# Patient Record
Sex: Male | Born: 1960
Health system: Southern US, Community
[De-identification: ages and names within clinical notes are randomized; demographics above are authoritative.]

## PROBLEM LIST (undated history)

## (undated) DIAGNOSIS — T7840XA Allergy, unspecified, initial encounter: Secondary | ICD-10-CM

## (undated) DIAGNOSIS — E7800 Pure hypercholesterolemia, unspecified: Secondary | ICD-10-CM

## (undated) DIAGNOSIS — E039 Hypothyroidism, unspecified: Secondary | ICD-10-CM

## (undated) DIAGNOSIS — D333 Benign neoplasm of cranial nerves: Secondary | ICD-10-CM

## (undated) DIAGNOSIS — H9311 Tinnitus, right ear: Secondary | ICD-10-CM

## (undated) DIAGNOSIS — J310 Chronic rhinitis: Secondary | ICD-10-CM

## (undated) HISTORY — DX: Hypothyroidism, unspecified: E03.9

## (undated) HISTORY — PX: NO PAST SURGERIES: SHX2092

## (undated) HISTORY — PX: POLYPECTOMY: SHX149

## (undated) HISTORY — DX: Benign neoplasm of cranial nerves: D33.3

## (undated) HISTORY — DX: Chronic rhinitis: J31.0

## (undated) HISTORY — DX: Allergy, unspecified, initial encounter: T78.40XA

## (undated) HISTORY — PX: WISDOM TOOTH EXTRACTION: SHX21

## (undated) HISTORY — DX: Pure hypercholesterolemia, unspecified: E78.00

## (undated) HISTORY — DX: Tinnitus, right ear: H93.11

---

## 2006-11-11 ENCOUNTER — Ambulatory Visit: Payer: Self-pay | Admitting: Family Medicine

## 2006-11-11 LAB — CONVERTED CEMR LAB
ALT: 17 units/L (ref 0–40)
AST: 22 units/L (ref 0–37)
LDL DIRECT: 171.1 mg/dL
Triglyceride fasting, serum: 68 mg/dL (ref 0–149)

## 2007-02-17 ENCOUNTER — Ambulatory Visit: Payer: Self-pay | Admitting: Family Medicine

## 2007-02-17 LAB — CONVERTED CEMR LAB
ALT: 19 units/L (ref 0–40)
AST: 21 units/L (ref 0–37)
Cholesterol: 182 mg/dL (ref 0–200)
LDL Cholesterol: 115 mg/dL — ABNORMAL HIGH (ref 0–99)

## 2007-07-07 ENCOUNTER — Ambulatory Visit: Payer: Self-pay | Admitting: Family Medicine

## 2007-07-08 LAB — CONVERTED CEMR LAB
Cholesterol: 201 mg/dL (ref 0–200)
Total CHOL/HDL Ratio: 4.1

## 2007-07-09 ENCOUNTER — Telehealth (INDEPENDENT_AMBULATORY_CARE_PROVIDER_SITE_OTHER): Payer: Self-pay | Admitting: *Deleted

## 2007-08-13 ENCOUNTER — Ambulatory Visit: Payer: Self-pay | Admitting: Family Medicine

## 2007-12-22 ENCOUNTER — Telehealth (INDEPENDENT_AMBULATORY_CARE_PROVIDER_SITE_OTHER): Payer: Self-pay | Admitting: *Deleted

## 2007-12-31 ENCOUNTER — Ambulatory Visit: Payer: Self-pay | Admitting: Family Medicine

## 2007-12-31 DIAGNOSIS — H9319 Tinnitus, unspecified ear: Secondary | ICD-10-CM | POA: Insufficient documentation

## 2007-12-31 DIAGNOSIS — J309 Allergic rhinitis, unspecified: Secondary | ICD-10-CM | POA: Insufficient documentation

## 2008-02-09 ENCOUNTER — Encounter: Payer: Self-pay | Admitting: Family Medicine

## 2008-02-19 ENCOUNTER — Telehealth (INDEPENDENT_AMBULATORY_CARE_PROVIDER_SITE_OTHER): Payer: Self-pay | Admitting: *Deleted

## 2008-02-27 ENCOUNTER — Encounter (INDEPENDENT_AMBULATORY_CARE_PROVIDER_SITE_OTHER): Payer: Self-pay | Admitting: Family Medicine

## 2008-03-03 ENCOUNTER — Ambulatory Visit: Payer: Self-pay | Admitting: Family Medicine

## 2008-07-05 ENCOUNTER — Ambulatory Visit: Payer: Self-pay | Admitting: *Deleted

## 2008-07-05 DIAGNOSIS — E7800 Pure hypercholesterolemia, unspecified: Secondary | ICD-10-CM | POA: Insufficient documentation

## 2008-07-05 DIAGNOSIS — M25519 Pain in unspecified shoulder: Secondary | ICD-10-CM | POA: Insufficient documentation

## 2008-07-05 LAB — CONVERTED CEMR LAB
ALT: 22 units/L (ref 0–53)
AST: 24 units/L (ref 0–37)
Alkaline Phosphatase: 54 units/L (ref 39–117)
BUN: 16 mg/dL (ref 6–23)
Bilirubin, Direct: 0.1 mg/dL (ref 0.0–0.3)
Creatinine, Ser: 0.9 mg/dL (ref 0.4–1.5)
Eosinophils Relative: 2.9 % (ref 0.0–5.0)
GFR calc Af Amer: 117 mL/min
Glucose, Bld: 93 mg/dL (ref 70–99)
HCT: 44 % (ref 39.0–52.0)
Lymphocytes Relative: 17.1 % (ref 12.0–46.0)
Monocytes Relative: 8 % (ref 3.0–12.0)
Neutrophils Relative %: 72 % (ref 43.0–77.0)
Phosphorus: 3.2 mg/dL (ref 2.3–4.6)
Platelets: 221 10*3/uL (ref 150–400)
Total Bilirubin: 1.4 mg/dL — ABNORMAL HIGH (ref 0.3–1.2)
Total CHOL/HDL Ratio: 4.4
Total Protein: 7.6 g/dL (ref 6.0–8.3)
Triglycerides: 126 mg/dL (ref 0–149)
VLDL: 25 mg/dL (ref 0–40)
WBC: 7.1 10*3/uL (ref 4.5–10.5)

## 2008-07-06 ENCOUNTER — Encounter (INDEPENDENT_AMBULATORY_CARE_PROVIDER_SITE_OTHER): Payer: Self-pay | Admitting: *Deleted

## 2008-07-06 DIAGNOSIS — R17 Unspecified jaundice: Secondary | ICD-10-CM | POA: Insufficient documentation

## 2008-07-21 ENCOUNTER — Telehealth (INDEPENDENT_AMBULATORY_CARE_PROVIDER_SITE_OTHER): Payer: Self-pay | Admitting: *Deleted

## 2008-07-26 ENCOUNTER — Ambulatory Visit (HOSPITAL_BASED_OUTPATIENT_CLINIC_OR_DEPARTMENT_OTHER): Admission: RE | Admit: 2008-07-26 | Discharge: 2008-07-26 | Payer: Self-pay | Admitting: *Deleted

## 2008-07-29 ENCOUNTER — Encounter: Admission: RE | Admit: 2008-07-29 | Discharge: 2008-08-19 | Payer: Self-pay | Admitting: *Deleted

## 2008-07-30 ENCOUNTER — Ambulatory Visit: Payer: Self-pay | Admitting: *Deleted

## 2008-07-30 LAB — CONVERTED CEMR LAB
ALT: 23 units/L (ref 0–53)
AST: 20 units/L (ref 0–37)
Albumin: 4.4 g/dL (ref 3.5–5.2)
Alkaline Phosphatase: 53 units/L (ref 39–117)

## 2008-08-03 ENCOUNTER — Telehealth (INDEPENDENT_AMBULATORY_CARE_PROVIDER_SITE_OTHER): Payer: Self-pay | Admitting: *Deleted

## 2008-08-11 ENCOUNTER — Encounter (INDEPENDENT_AMBULATORY_CARE_PROVIDER_SITE_OTHER): Payer: Self-pay | Admitting: *Deleted

## 2008-08-13 ENCOUNTER — Ambulatory Visit: Payer: Self-pay | Admitting: *Deleted

## 2008-08-16 LAB — CONVERTED CEMR LAB
Cholesterol: 239 mg/dL (ref 0–200)
Total CHOL/HDL Ratio: 5.8

## 2008-08-19 ENCOUNTER — Telehealth (INDEPENDENT_AMBULATORY_CARE_PROVIDER_SITE_OTHER): Payer: Self-pay | Admitting: *Deleted

## 2008-08-19 ENCOUNTER — Encounter (INDEPENDENT_AMBULATORY_CARE_PROVIDER_SITE_OTHER): Payer: Self-pay | Admitting: *Deleted

## 2008-08-20 ENCOUNTER — Telehealth (INDEPENDENT_AMBULATORY_CARE_PROVIDER_SITE_OTHER): Payer: Self-pay | Admitting: *Deleted

## 2008-09-01 ENCOUNTER — Encounter (INDEPENDENT_AMBULATORY_CARE_PROVIDER_SITE_OTHER): Payer: Self-pay | Admitting: *Deleted

## 2008-09-03 ENCOUNTER — Telehealth (INDEPENDENT_AMBULATORY_CARE_PROVIDER_SITE_OTHER): Payer: Self-pay | Admitting: *Deleted

## 2008-09-08 ENCOUNTER — Ambulatory Visit (HOSPITAL_BASED_OUTPATIENT_CLINIC_OR_DEPARTMENT_OTHER): Admission: RE | Admit: 2008-09-08 | Discharge: 2008-09-08 | Payer: Self-pay | Admitting: *Deleted

## 2008-10-08 ENCOUNTER — Encounter (INDEPENDENT_AMBULATORY_CARE_PROVIDER_SITE_OTHER): Payer: Self-pay | Admitting: *Deleted

## 2009-04-26 ENCOUNTER — Ambulatory Visit: Payer: Self-pay | Admitting: Internal Medicine

## 2009-04-26 LAB — CONVERTED CEMR LAB
AST: 28 units/L (ref 0–37)
Chloride: 105 meq/L (ref 96–112)
Cholesterol, target level: 200 mg/dL
Cholesterol: 193 mg/dL (ref 0–200)
Creatinine, Ser: 0.9 mg/dL (ref 0.4–1.5)
GFR calc non Af Amer: 96.2 mL/min (ref >60)
HDL goal, serum: 40 mg/dL
HDL: 41.4 mg/dL (ref >39.00)
LDL Cholesterol: 133 mg/dL — ABNORMAL HIGH (ref 0–99)
LDL Goal: 160 mg/dL
TSH: 2.94 microintl units/mL (ref 0.35–5.50)
Total Bilirubin: 0.8 mg/dL (ref 0.3–1.2)
Triglycerides: 91 mg/dL (ref 0.0–149.0)
VLDL: 18.2 mg/dL (ref 0.0–40.0)

## 2009-10-17 ENCOUNTER — Ambulatory Visit: Payer: Self-pay | Admitting: Internal Medicine

## 2009-10-17 LAB — CONVERTED CEMR LAB
ALT: 18 units/L (ref 0–53)
AST: 19 units/L (ref 0–37)
Albumin: 4.7 g/dL (ref 3.5–5.2)
Alkaline Phosphatase: 53 units/L (ref 39–117)
Total Bilirubin: 0.6 mg/dL (ref 0.3–1.2)
Total Protein: 6.7 g/dL (ref 6.0–8.3)

## 2009-10-24 ENCOUNTER — Ambulatory Visit: Payer: Self-pay | Admitting: Internal Medicine

## 2010-07-25 IMAGING — CR DG SHOULDER 2+V*R*
3 series · 3 of 3 positions shown · non-contrast
Comparison: 

CLINICAL DATA: Pain, no injury

RIGHT SHOULDER - 2+ VIEW

[w shoulder ap internal righ]
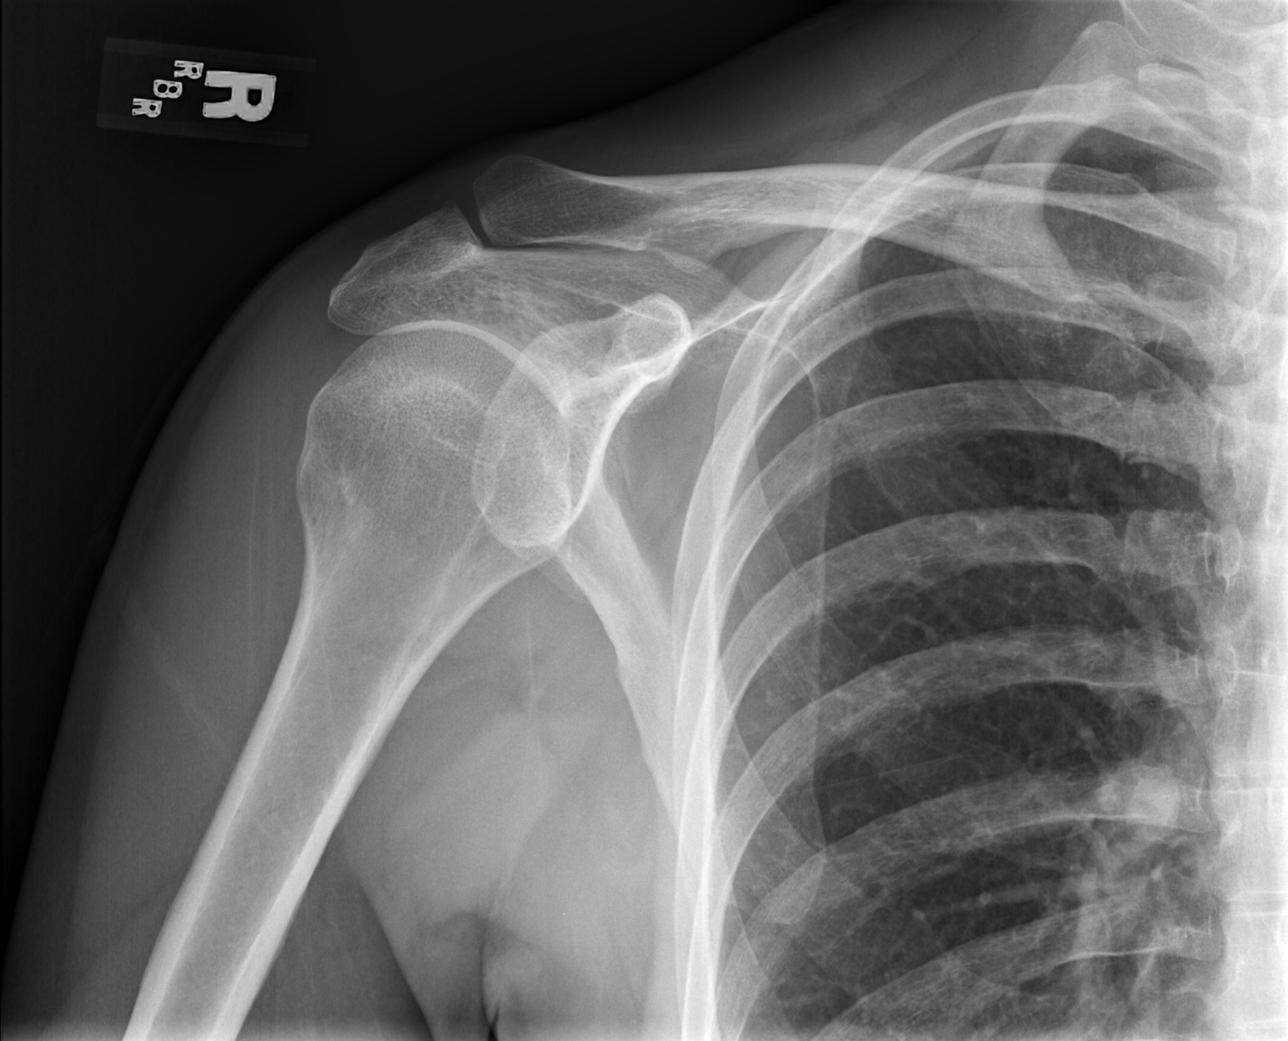

[w shoulder ap external righ]
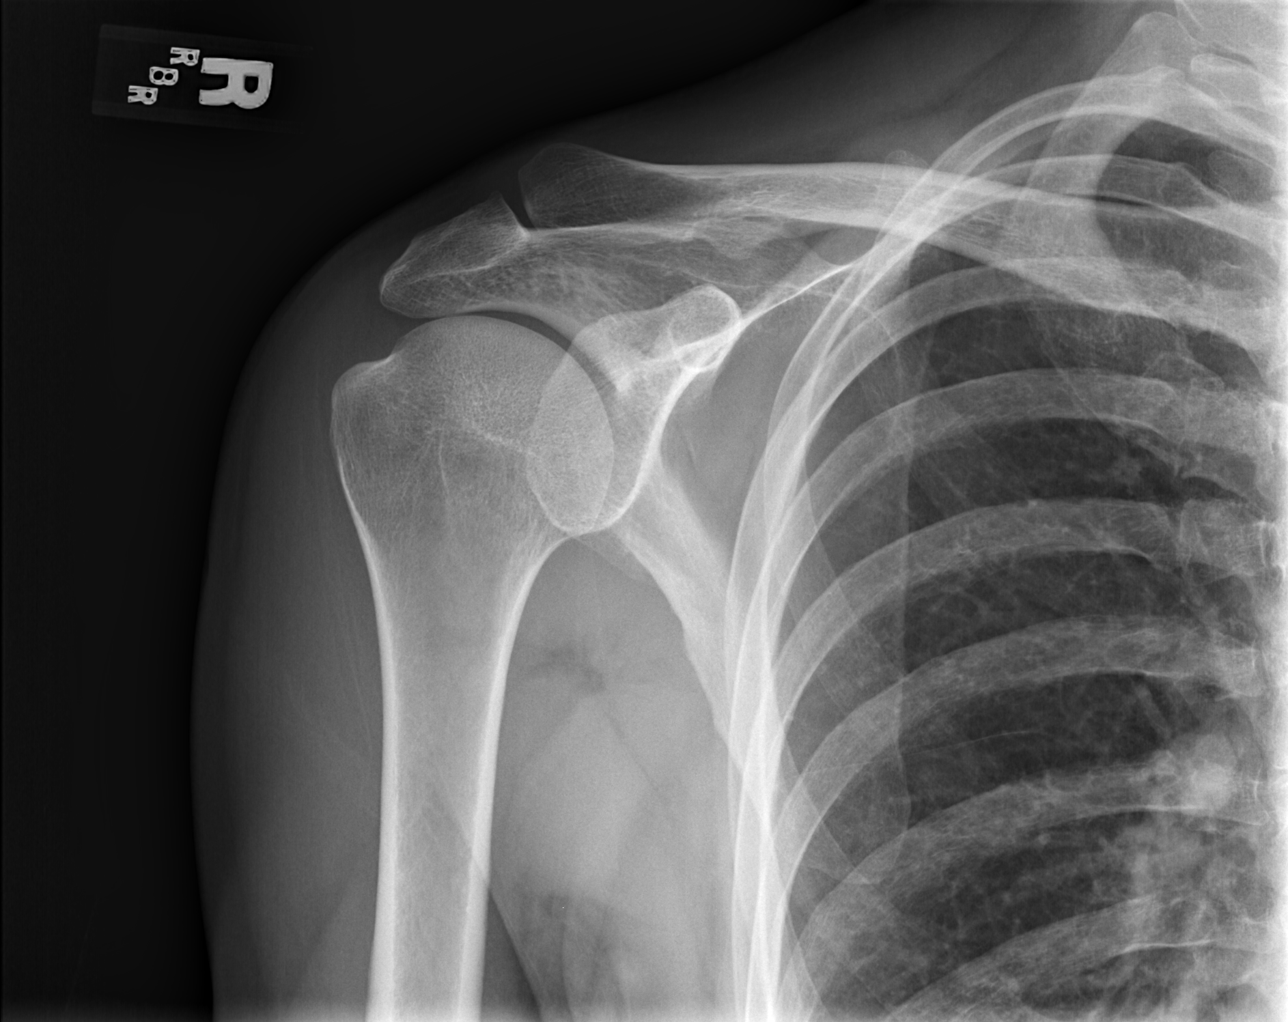

[x shoulder axillary right]
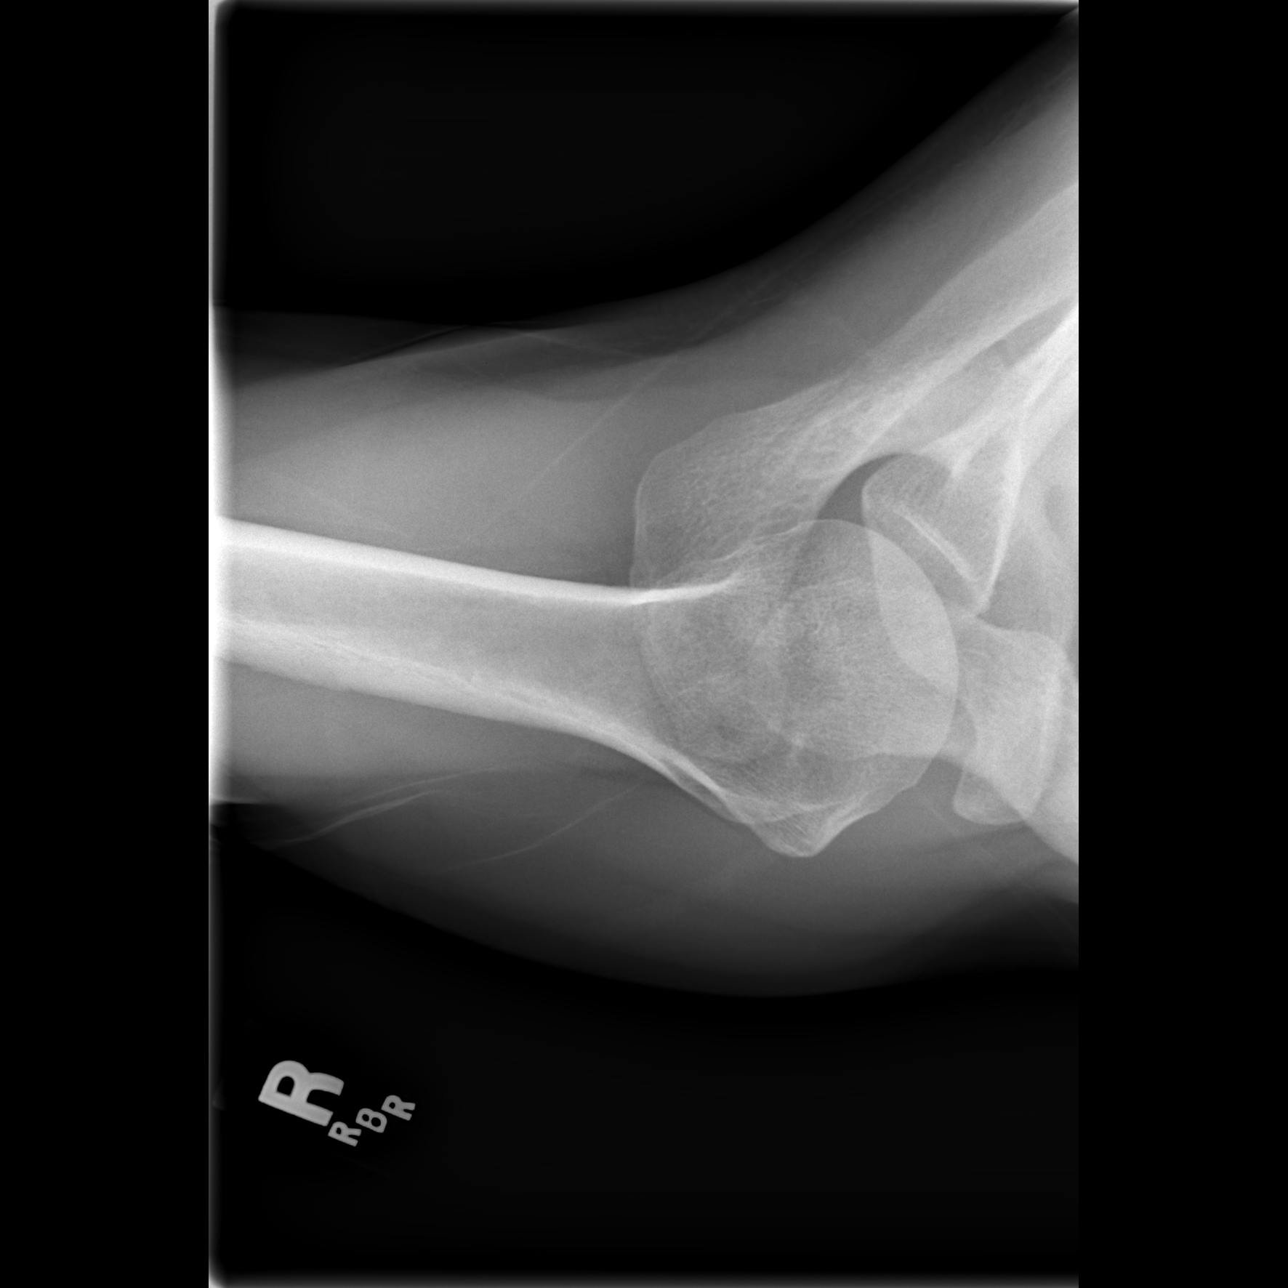

[3 of 3 positions shown; findings below may reference images not displayed]

FINDINGS: None the glenohumeral joint space appears normal.  The AC
joint is normally aligned.  No acute abnormality is seen.  The
acromion is slightly downward sloping which can predispose to
impingement.
IMPRESSION: No acute abnormality.  Downward sloping acromion can predispose to
impingement.

## 2010-09-07 IMAGING — MR MR [PERSON_NAME] UP JT W/O CM*R*
5 series · 40 of 40 positions shown · non-contrast
Comparison: 07/26/2008

CLINICAL DATA: Right shoulder pain with decreased range of motion.

MRI OF THE RIGHT SHOULDER WITHOUT CONTRAST
TECHNIQUE: Multiplanar, multisequence MR imaging of the right
shoulder was performed.  No intravenous contrast was administered.

[Series 102: PD · axial · 4.0mm · 0.55mm/px · z∈[-43,+47]mm · 8 of 19 slices shown (1 of 2)]
[im 1/19]
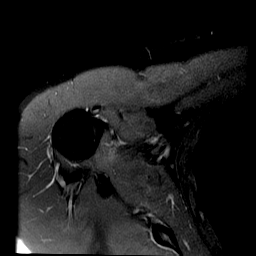
[im 3/19]
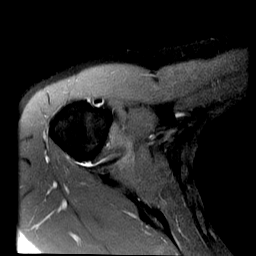
[im 6/19]
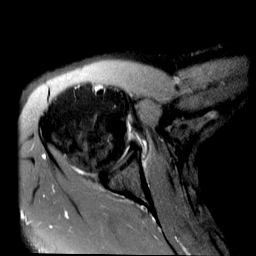
[im 8/19]
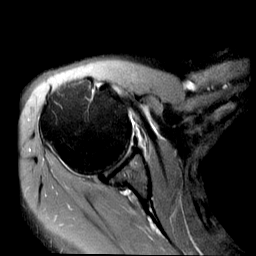
[im 11/19]
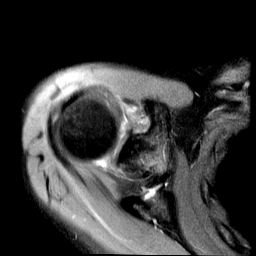
[im 13/19]
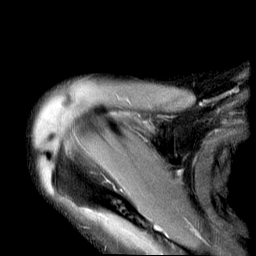
[im 16/19]
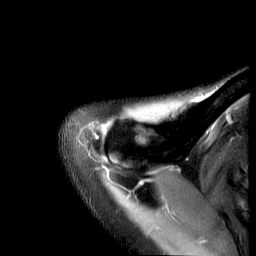
[im 19/19]
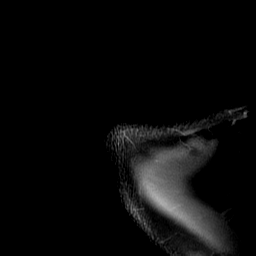

[Series 103: T2 · oblique · 4.0mm · 0.55mm/px · 8 of 19 slices shown (1 of 2)]
[im 1/19]
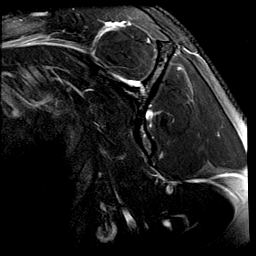
[im 3/19]
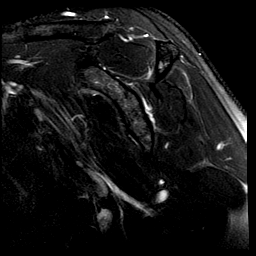
[im 6/19]
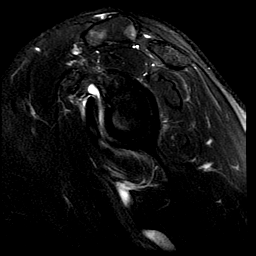
[im 8/19]
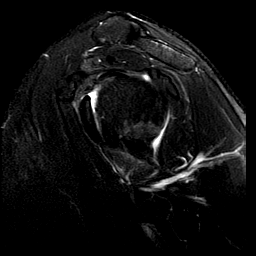
[im 11/19]
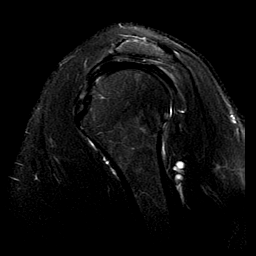
[im 13/19]
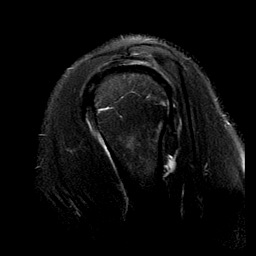
[im 16/19]
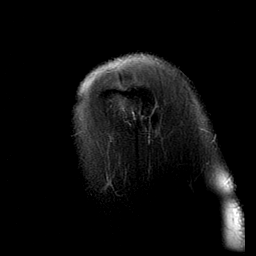
[im 19/19]
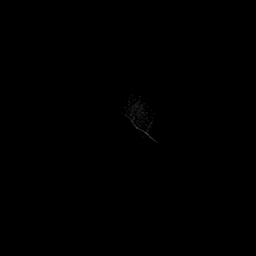

[Series 104: T1 · oblique · 4.0mm · 0.55mm/px · 8 of 17 slices shown]
[im 1/17]
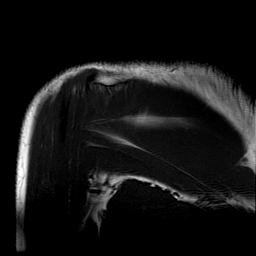
[im 3/17]
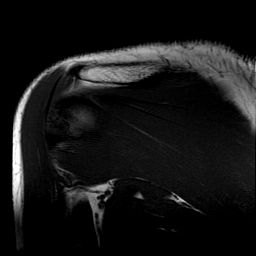
[im 5/17]
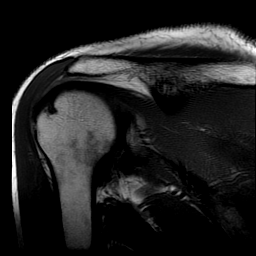
[im 7/17]
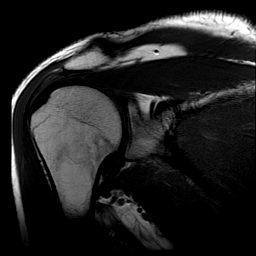
[im 10/17]
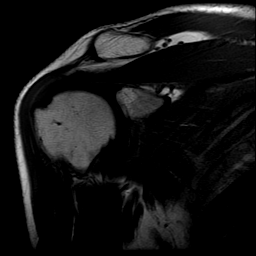
[im 12/17]
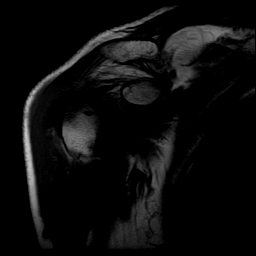
[im 14/17]
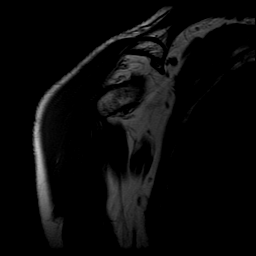
[im 17/17]
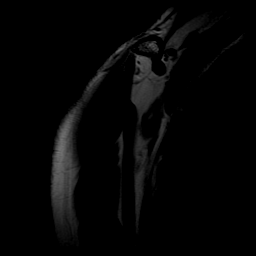

[Series 105: PD · oblique · 4.0mm · 0.55mm/px · 8 of 17 slices shown (2 of 2)]
[im 1/17]
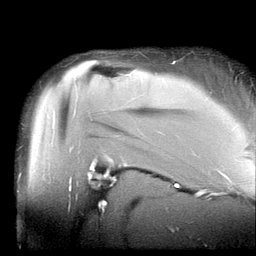
[im 3/17]
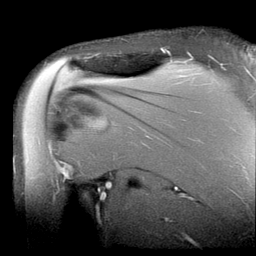
[im 5/17]
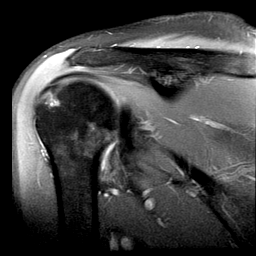
[im 7/17]
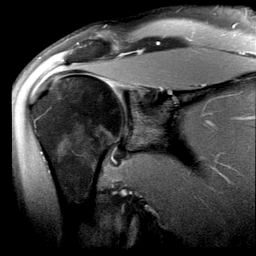
[im 10/17]
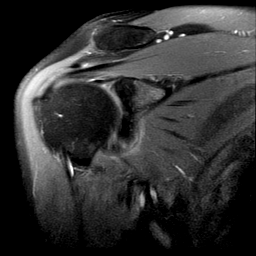
[im 12/17]
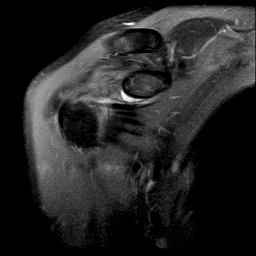
[im 14/17]
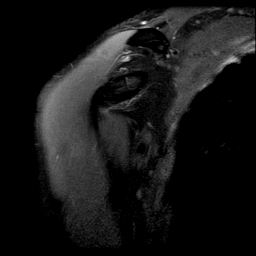
[im 17/17]
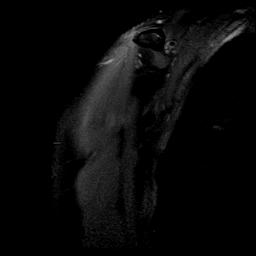

[Series 106: T2 · oblique · 4.0mm · 0.55mm/px · 8 of 17 slices shown (2 of 2)]
[im 1/17]
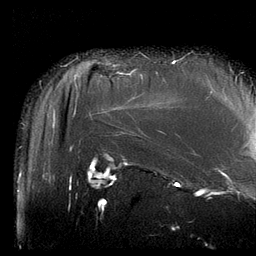
[im 3/17]
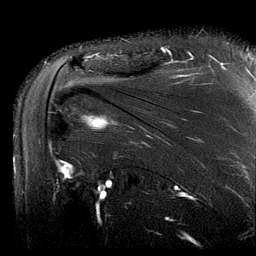
[im 5/17]
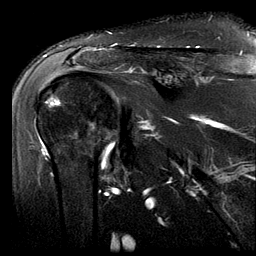
[im 7/17]
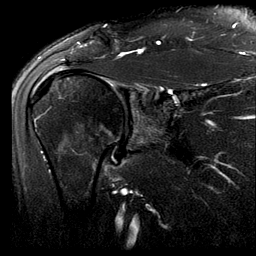
[im 10/17]
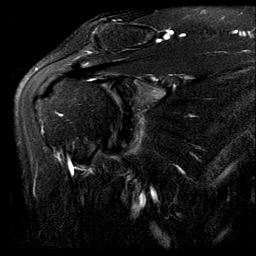
[im 12/17]
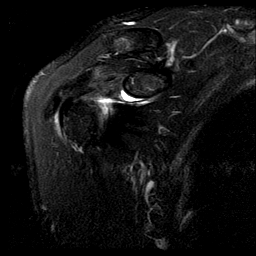
[im 14/17]
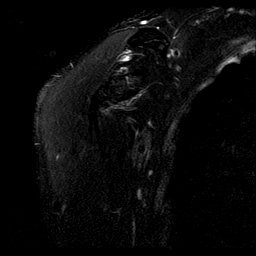
[im 17/17]
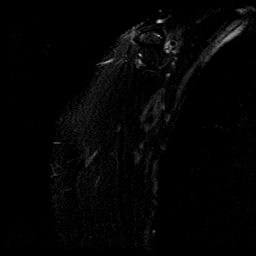

[40 of 40 positions shown; findings below may reference images not displayed]

FINDINGS: Mild infraspinatus tendinopathy is present.  There is
mild supraspinatus tendinopathy, particularly along the bursal
surface, with a trace amount of fluid in the subacromial subdeltoid
bursa.

Mild subscapularis tendinopathy is present.

The long head of the biceps appears intact.

There is minimal degenerative acromioclavicular arthropathy.  The
acromial undersurface is type 1 (flat).  However, the acromion is
laterally downsloping, which can predispose to impingement.

The inferior glenohumeral ligament appears intact.  No definite
glenoid labral tear is noted.

Linear internal signal in the superior labrum likely represents
labral degeneration.  No overt labral tear is identified.
IMPRESSION: 1.  Rotator cuff tendinopathy with trace subacromial subdeltoid
bursitis, but no full-thickness rotator cuff tear is identified.
2.  Mildly laterally downsloping acromion, which can predispose to
impingement.

## 2011-01-15 ENCOUNTER — Encounter: Payer: Self-pay | Admitting: *Deleted

## 2011-05-08 ENCOUNTER — Encounter: Payer: Self-pay | Admitting: Internal Medicine

## 2011-05-14 ENCOUNTER — Telehealth: Payer: Self-pay | Admitting: *Deleted

## 2011-05-14 ENCOUNTER — Ambulatory Visit: Payer: Self-pay | Admitting: Internal Medicine

## 2011-05-14 MED ORDER — SIMVASTATIN 40 MG PO TABS: 40.0000 mg | ORAL_TABLET | Freq: Every day | ORAL | Status: DC

## 2011-05-14 NOTE — Telephone Encounter (Signed)
Patient was scheduled for cpe on 5-22.  Dr Artist Pais is ill may he hav a refill on his [facility]atin [facility] [address]

## 2011-05-14 NOTE — Telephone Encounter (Signed)
Rx refill sent to pharmacy. 

## 2011-05-25 ENCOUNTER — Encounter: Payer: Self-pay | Admitting: Internal Medicine

## 2011-06-11 ENCOUNTER — Encounter: Payer: Self-pay | Admitting: Internal Medicine

## 2011-06-14 ENCOUNTER — Encounter: Payer: Self-pay | Admitting: Internal Medicine

## 2011-06-14 ENCOUNTER — Ambulatory Visit (INDEPENDENT_AMBULATORY_CARE_PROVIDER_SITE_OTHER): Payer: BC Managed Care – PPO | Admitting: Internal Medicine

## 2011-06-14 VITALS — BP 100/70 | Temp 97.8°F | Ht 69.0 in | Wt 140.1 lb

## 2011-06-14 DIAGNOSIS — Z0000 Encounter for general adult medical examination without abnormal findings: Secondary | ICD-10-CM

## 2011-06-14 MED ORDER — FLUTICASONE PROPIONATE 50 MCG/ACT NA SUSP: 1.0000 | Freq: Every day | NASAL | Status: DC

## 2011-06-14 NOTE — Progress Notes (Signed)
  Subjective:    Patient ID: Thomas Valencia, male    DOB: 1961/11/29, 50 y.o.   MRN: [id number]  HPI Pt presents to clinic for CPE. Notes chronic intermittent sx's of allergic rhinitis including nasal congestion and drainage. Attempted flonase previously but had nasal irritation/bleeding with qhs use. Interested in retrying. Tolerates statin tx without myalgias or abn lft's. No other active complaint.  Reviewed pmh, medications and allergies.    Review of Systems  HENT: Positive for congestion and rhinorrhea.   All other systems reviewed and are negative.       Objective:   Physical Exam  Nursing note and vitals reviewed. Constitutional: He appears well-developed and well-nourished. No distress.  HENT:  Head: Normocephalic and atraumatic.  Right Ear: Tympanic membrane, external ear and ear canal normal.  Left Ear: Tympanic membrane, external ear and ear canal normal.  Nose: Nose normal.  Mouth/Throat: Oropharynx is clear and moist. No oropharyngeal exudate.  Eyes: Conjunctivae and EOM are normal. Pupils are equal, round, and reactive to light. Right eye exhibits no discharge. Left eye exhibits no discharge. No scleral icterus.  Neck: Normal range of motion. Neck supple. No JVD present. Carotid bruit is not present. No thyromegaly present.  Cardiovascular: Normal rate, regular rhythm, normal heart sounds and intact distal pulses.  Exam reveals no gallop and no friction rub.   No murmur heard. Pulmonary/Chest: Effort normal and breath sounds normal. No respiratory distress. He has no wheezes. He has no rales.  Abdominal: Soft. Bowel sounds are normal. He exhibits no distension and no mass. There is no tenderness. There is no rebound and no guarding. Hernia confirmed negative in the right inguinal area and confirmed negative in the left inguinal area.  Genitourinary: Rectum normal, prostate normal, testes normal and penis normal. Guaiac negative stool. Right testis shows no mass. Left  testis shows no mass.  Lymphadenopathy:    He has no cervical adenopathy.       Right: No inguinal adenopathy present.       Left: No inguinal adenopathy present.  Neurological: He is alert.  Skin: Skin is warm and dry. No rash noted. He is not diaphoretic. No erythema.  Psychiatric: He has a normal mood and affect.          Assessment & Plan:

## 2011-06-14 NOTE — Assessment & Plan Note (Signed)
Exam nl. Obtain screening labs. Provided with hemoccult cards x 3 to complete. Re-attempt flonsae-Rf provided.

## 2011-06-15 LAB — HEPATIC FUNCTION PANEL
ALT: 15 U/L (ref 0–53)
AST: 19 U/L (ref 0–37)
Alkaline Phosphatase: 53 U/L (ref 39–117)
Bilirubin, Direct: 0.1 mg/dL (ref 0.0–0.3)
Indirect Bilirubin: 0.7 mg/dL (ref 0.0–0.9)
Total Protein: 7.2 g/dL (ref 6.0–8.3)

## 2011-06-15 LAB — BASIC METABOLIC PANEL
BUN: 25 mg/dL — ABNORMAL HIGH (ref 6–23)
Calcium: 9.7 mg/dL (ref 8.4–10.5)
Creat: 0.98 mg/dL (ref 0.50–1.35)
Glucose, Bld: 90 mg/dL (ref 70–99)
Potassium: 4.6 mEq/L (ref 3.5–5.3)

## 2011-06-15 LAB — URINALYSIS, ROUTINE W REFLEX MICROSCOPIC
Hgb urine dipstick: NEGATIVE
Leukocytes, UA: NEGATIVE
Nitrite: NEGATIVE
pH: 6.5 (ref 5.0–8.0)

## 2011-06-15 LAB — LIPID PANEL
Cholesterol: 197 mg/dL (ref 0–200)
Triglycerides: 98 mg/dL (ref <150)
VLDL: 20 mg/dL (ref 0–40)

## 2011-06-15 LAB — PSA: PSA: 1.95 ng/mL (ref <=4.00)

## 2011-06-15 LAB — CBC
MCHC: 33.9 g/dL (ref 30.0–36.0)
Platelets: 212 10*3/uL (ref 150–400)
RDW: 13.6 % (ref 11.5–15.5)
WBC: 5 10*3/uL (ref 4.0–10.5)

## 2011-06-19 ENCOUNTER — Encounter: Payer: Self-pay | Admitting: *Deleted

## 2011-06-21 NOTE — Telephone Encounter (Signed)
Encounter opened in error

## 2011-07-09 ENCOUNTER — Telehealth: Payer: Self-pay | Admitting: Internal Medicine

## 2011-07-09 DIAGNOSIS — E7800 Pure hypercholesterolemia, unspecified: Secondary | ICD-10-CM

## 2011-07-09 NOTE — Telephone Encounter (Signed)
Pt would like last labs mailed to him and also he would like a refill of simvastatin sent to [facility] on [address]. He is out of med and leaving town on Friday for 10 days.

## 2011-07-10 MED ORDER — SIMVASTATIN 40 MG PO TABS: 40.0000 mg | ORAL_TABLET | Freq: Every day | ORAL | Status: DC

## 2011-07-10 NOTE — Telephone Encounter (Signed)
Labs printed and mailed and RX sent.

## 2011-08-22 ENCOUNTER — Other Ambulatory Visit: Payer: Self-pay | Admitting: Internal Medicine

## 2011-08-22 NOTE — Telephone Encounter (Signed)
This is dr Artist Pais

## 2011-08-22 NOTE — Telephone Encounter (Signed)
rx sent in electronically 

## 2012-02-12 ENCOUNTER — Other Ambulatory Visit: Payer: Self-pay | Admitting: Internal Medicine

## 2012-02-12 MED ORDER — SIMVASTATIN 40 MG PO TABS: 40.0000 mg | ORAL_TABLET | Freq: Every day | ORAL | Status: DC

## 2012-02-12 NOTE — Telephone Encounter (Signed)
Ok for june

## 2012-02-12 NOTE — Telephone Encounter (Signed)
30 day supply of Simvastatin was sent to pharmacy x no refills as pt is past due for f/u. Pt was last seen in June 2012 and was due for f/u in December. Contacted pt to arrange appt and pt requests to wait until June and schedule a yearly physical then.  Please advise.

## 2012-02-12 NOTE — Telephone Encounter (Signed)
Simvastatin refills sent to pharmacy to last pt until June. Pt notified and scheduled a fasting physical for 06/02/12 at 10am.

## 2012-04-05 ENCOUNTER — Other Ambulatory Visit: Payer: Self-pay | Admitting: Internal Medicine

## 2012-04-07 NOTE — Telephone Encounter (Signed)
Rx refill sent to pharmacy; office visit due for refills.

## 2012-05-31 ENCOUNTER — Other Ambulatory Visit: Payer: Self-pay | Admitting: Internal Medicine

## 2012-06-02 ENCOUNTER — Encounter: Payer: BC Managed Care – PPO | Admitting: Internal Medicine

## 2012-06-02 NOTE — Telephone Encounter (Signed)
Rx refill denied. Patient is scheduled to follow up on 06/05/2012. Will refill at office visit.

## 2012-06-05 ENCOUNTER — Telehealth: Payer: Self-pay | Admitting: Internal Medicine

## 2012-06-05 ENCOUNTER — Ambulatory Visit (INDEPENDENT_AMBULATORY_CARE_PROVIDER_SITE_OTHER): Payer: BC Managed Care – PPO | Admitting: Internal Medicine

## 2012-06-05 ENCOUNTER — Encounter: Payer: Self-pay | Admitting: Internal Medicine

## 2012-06-05 VITALS — BP 100/70 | HR 84 | Temp 98.2°F | Resp 16 | Ht 69.0 in | Wt 141.0 lb

## 2012-06-05 DIAGNOSIS — Z125 Encounter for screening for malignant neoplasm of prostate: Secondary | ICD-10-CM

## 2012-06-05 DIAGNOSIS — Z0000 Encounter for general adult medical examination without abnormal findings: Secondary | ICD-10-CM | POA: Insufficient documentation

## 2012-06-05 LAB — CBC WITH DIFFERENTIAL/PLATELET
Basophils Relative: 1 % (ref 0–1)
Eosinophils Absolute: 0.1 10*3/uL (ref 0.0–0.7)
Eosinophils Relative: 2 % (ref 0–5)
Lymphs Abs: 1.9 10*3/uL (ref 0.7–4.0)
MCH: 28.5 pg (ref 26.0–34.0)
MCHC: 33.6 g/dL (ref 30.0–36.0)
MCV: 84.8 fL (ref 78.0–100.0)
Neutrophils Relative %: 67 % (ref 43–77)
Platelets: 276 10*3/uL (ref 150–400)
RDW: 13.6 % (ref 11.5–15.5)

## 2012-06-05 LAB — BASIC METABOLIC PANEL
CO2: 30 mEq/L (ref 19–32)
Calcium: 9.6 mg/dL (ref 8.4–10.5)
Creat: 0.96 mg/dL (ref 0.50–1.35)
Sodium: 138 mEq/L (ref 135–145)

## 2012-06-05 LAB — HEPATIC FUNCTION PANEL
Albumin: 4.5 g/dL (ref 3.5–5.2)
Alkaline Phosphatase: 54 U/L (ref 39–117)
Indirect Bilirubin: 0.7 mg/dL (ref 0.0–0.9)
Total Bilirubin: 0.8 mg/dL (ref 0.3–1.2)
Total Protein: 6.9 g/dL (ref 6.0–8.3)

## 2012-06-05 LAB — LIPID PANEL
HDL: 55 mg/dL (ref >39)
LDL Cholesterol: 154 mg/dL — ABNORMAL HIGH (ref 0–99)
Total CHOL/HDL Ratio: 4.6 Ratio
Triglycerides: 221 mg/dL — ABNORMAL HIGH (ref <150)

## 2012-06-05 MED ORDER — SIMVASTATIN 40 MG PO TABS: 40.0000 mg | ORAL_TABLET | Freq: Every day | ORAL | Status: DC

## 2012-06-05 NOTE — Patient Instructions (Signed)
Please schedule fasting labs next year prior to your physical cpe labs with psa-v70.0

## 2012-06-05 NOTE — Telephone Encounter (Signed)
Please schedule fasting labs next year prior to your physical  cpe labs with psa-v70.0     Patient scheduled cpe for 6/14. He will be going to [facility] lab.

## 2012-06-05 NOTE — Telephone Encounter (Signed)
Lab orders entered for June 2014.

## 2012-06-05 NOTE — Progress Notes (Signed)
  Subjective:    Patient ID: Thomas Valencia, male    DOB: Jan 10, 1961, 51 y.o.   MRN: [id number]  HPI Pt presents to clinic for annual physical. No recent complaints. Off statin for 10 days. No side effect from statin.   Past Medical History  Diagnosis Date  . Hypercholesteremia   . Hearing loss   . Tinnitus, right   . Rhinitis    Past Surgical History  Procedure Date  . No past surgeries     denies surgical history    reports that he has never smoked. He has never used smokeless tobacco. He reports that he does not drink alcohol or use illicit drugs. family history includes Hyperlipidemia in his mother and Hypertension in his brother and mother.  There is no history of Other. No Known Allergies   Review of Systems see hpi    Objective:   Physical Exam  Physical Exam  Nursing note and vitals reviewed. Constitutional: He appears well-developed and well-nourished. No distress.  HENT:  Head: Normocephalic and atraumatic.  Right Ear: Tympanic membrane and external ear normal.  Left Ear: Tympanic membrane and external ear normal.  Nose: Nose normal.  Mouth/Throat: Uvula is midline, oropharynx is clear and moist and mucous membranes are normal. No oropharyngeal exudate.  Eyes: Conjunctivae and EOM are normal. Pupils are equal, round, and reactive to light. Right eye exhibits no discharge. Left eye exhibits no discharge. No scleral icterus.  Neck: Neck supple. Carotid bruit is not present. No thyromegaly present.  Cardiovascular: Normal rate, regular rhythm and normal heart sounds.  Exam reveals no gallop and no friction rub.   No murmur heard. Pulmonary/Chest: Effort normal and breath sounds normal. No respiratory distress. He has no wheezes. He has no rales.  Abdominal: Soft. He exhibits no distension and no mass. There is no hepatosplenomegaly. There is no tenderness. There is no rebound. Hernia confirmed negative in the right inguinal area and confirmed negative in the left  inguinal area.   Lymphadenopathy:    He has no cervical adenopathy.      Neurological: He is alert.  Skin: Skin is warm and dry. He is not diaphoretic.  Psychiatric: He has a normal mood and affect.        Assessment & Plan:

## 2012-06-05 NOTE — Assessment & Plan Note (Signed)
Nl exam. Obtain cpe labs. EKG obtained demonstrates nsr 73 with nl intervals and axis. Given hemoccult cards x 3 to complete.

## 2012-06-06 LAB — URINALYSIS, ROUTINE W REFLEX MICROSCOPIC
Bilirubin Urine: NEGATIVE
Hgb urine dipstick: NEGATIVE
Ketones, ur: NEGATIVE mg/dL
Specific Gravity, Urine: 1.022 (ref 1.005–1.030)
pH: 7 (ref 5.0–8.0)

## 2012-08-26 ENCOUNTER — Other Ambulatory Visit: Payer: Self-pay | Admitting: Internal Medicine

## 2012-08-26 NOTE — Telephone Encounter (Signed)
Rx done/SLS 

## 2012-12-17 ENCOUNTER — Other Ambulatory Visit: Payer: Self-pay | Admitting: Internal Medicine

## 2012-12-18 NOTE — Telephone Encounter (Signed)
Rx to pharmacy/SLS 

## 2012-12-21 ENCOUNTER — Other Ambulatory Visit: Payer: Self-pay | Admitting: Internal Medicine

## 2012-12-24 DIAGNOSIS — D333 Benign neoplasm of cranial nerves: Secondary | ICD-10-CM

## 2012-12-24 HISTORY — DX: Benign neoplasm of cranial nerves: D33.3

## 2013-02-07 ENCOUNTER — Other Ambulatory Visit: Payer: Self-pay

## 2013-06-08 ENCOUNTER — Ambulatory Visit (INDEPENDENT_AMBULATORY_CARE_PROVIDER_SITE_OTHER): Payer: BC Managed Care – PPO | Admitting: Family

## 2013-06-08 ENCOUNTER — Encounter: Payer: Self-pay | Admitting: Family

## 2013-06-08 VITALS — BP 112/70 | HR 79 | Temp 98.0°F | Wt 145.0 lb

## 2013-06-08 DIAGNOSIS — Z0000 Encounter for general adult medical examination without abnormal findings: Secondary | ICD-10-CM

## 2013-06-08 DIAGNOSIS — Z23 Encounter for immunization: Secondary | ICD-10-CM

## 2013-06-08 LAB — CBC WITH DIFFERENTIAL/PLATELET
Basophils Absolute: 0 10*3/uL (ref 0.0–0.1)
Basophils Relative: 0 % (ref 0–1)
Lymphocytes Relative: 18 % (ref 12–46)
MCHC: 33.6 g/dL (ref 30.0–36.0)
Neutro Abs: 6.2 10*3/uL (ref 1.7–7.7)
Neutrophils Relative %: 74 % (ref 43–77)
RDW: 13.8 % (ref 11.5–15.5)
WBC: 8.4 10*3/uL (ref 4.0–10.5)

## 2013-06-08 LAB — HEMOGLOBIN A1C: Hgb A1c MFr Bld: 5.5 % (ref <5.7)

## 2013-06-08 LAB — TSH: TSH: 2.578 u[IU]/mL (ref 0.350–4.500)

## 2013-06-08 NOTE — Progress Notes (Signed)
Subjective:    Patient ID: Thomas Valencia, male    DOB: 09-25-1961, 52 y.o.   MRN: [id number]  HPI  Patient presents today for complete physical.  Immunizations: Due for tetanus Diet: Not eating a healthy diet  Exercise:  Not exercising Colonoscopy: Due     Review of Systems  Constitutional: Negative for unexpected weight change.  HENT: Positive for rhinorrhea.   Eyes: Negative for visual disturbance.  Respiratory: Negative for cough and shortness of breath.   Cardiovascular: Negative for chest pain and leg swelling.  Gastrointestinal: Negative for nausea, vomiting and diarrhea.  Genitourinary: Negative for dysuria and frequency.  Musculoskeletal: Negative for myalgias and arthralgias.  Skin: Negative for rash.  Neurological: Negative for headaches.  Hematological: Negative for adenopathy.  Psychiatric/Behavioral:       Denies depression/anxiety   Reports that he is using nasonex for sinus congestion- improved Tinnitus R ear for years unchanged.  Past Medical History  Diagnosis Date  . Hypercholesteremia   . Hearing loss   . Tinnitus, right   . Rhinitis     History   Social History  . Marital Status: Married    Spouse Name: N/A    Number of Children: N/A  . Years of Education: N/A   Occupational History  . Not on file.   Social History Main Topics  . Smoking status: Never Smoker   . Smokeless tobacco: Never Used  . Alcohol Use: No  . Drug Use: No  . Sexually Active: Not on file   Other Topics Concern  . 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 No past surgeries      denies surgical history    Family History  Problem Relation Age of Onset  . Hyperlipidemia Mother   . Hypertension Mother   . Hypertension Brother   . Other  Neg Hx     colon ca, prostate ca    No Known Allergies  Current Outpatient Prescriptions on File Prior to Visit  Medication Sig Dispense Refill  . simvastatin (ZOCOR) 40 MG tablet TAKE 1 TABLET (40 MG TOTAL) BY MOUTH AT BEDTIME.  90 tablet  1   No current facility-administered medications on file prior to visit.    BP 112/70  Pulse 79  Temp(Src) 98 F (36.7 C) (Oral)  Wt 145 lb (65.772 kg)  BMI 21.4 kg/m2  SpO2 98%       Objective:   Physical Exam  Physical Exam  Constitutional: He is oriented to person, place, and time. He appears well-developed and well-nourished. No distress.  HENT:  Head: Normocephalic and atraumatic.  Right Ear: Tympanic membrane and ear canal normal.  Left Ear: Tympanic membrane and ear canal normal.  Mouth/Throat: Oropharynx is clear and moist.  Eyes: Pupils are equal, round, and reactive to light. No scleral icterus.  Neck: Normal range of motion. No thyromegaly present.  Cardiovascular: Normal rate and regular rhythm.   No murmur heard. Pulmonary/Chest: Effort normal and breath sounds normal. No respiratory distress. He has no wheezes. He has no rales. He exhibits no tenderness.  Abdominal: Soft. Bowel sounds are normal. He exhibits no distension and no mass. There is no tenderness. There is no rebound and no guarding.  Musculoskeletal: He exhibits  no edema.  Lymphadenopathy:    He has no cervical adenopathy.  Neurological: He is alert and oriented to person, place, and time. He has normal reflexes. He exhibits normal muscle tone. Coordination normal.  Skin: Skin is warm and dry.  Psychiatric: He has a normal mood and affect. His behavior is normal. Judgment and thought content normal.          Assessment & Plan:         Assessment & Plan:

## 2013-06-08 NOTE — Patient Instructions (Addendum)
Please complete your lab work prior to leaving.  You will be contacted about your referral for colonoscopy. Follow up in 1 year, sooner if problems/concerns.

## 2013-06-08 NOTE — Assessment & Plan Note (Signed)
We discussed healthy diet, exercise.  Refer for screening colo.  Obtain fasting labs including PSA (pros and con's discussed). Tdap today. EKG performed and notes NSR.

## 2013-06-09 ENCOUNTER — Telehealth: Payer: Self-pay | Admitting: Family

## 2013-06-09 DIAGNOSIS — R972 Elevated prostate specific antigen [PSA]: Secondary | ICD-10-CM

## 2013-06-09 LAB — BASIC METABOLIC PANEL WITH GFR
CO2: 31 mEq/L (ref 19–32)
Calcium: 9.4 mg/dL (ref 8.4–10.5)
Creat: 0.87 mg/dL (ref 0.50–1.35)
GFR, Est African American: >89 mL/min
Sodium: 138 mEq/L (ref 135–145)

## 2013-06-09 LAB — URINALYSIS, ROUTINE W REFLEX MICROSCOPIC
Leukocytes, UA: NEGATIVE
Nitrite: NEGATIVE
Protein, ur: NEGATIVE mg/dL
Urobilinogen, UA: 0.2 mg/dL (ref 0.0–1.0)

## 2013-06-09 LAB — LIPID PANEL
HDL: 48 mg/dL (ref >39)
LDL Cholesterol: 130 mg/dL — ABNORMAL HIGH (ref 0–99)
Total CHOL/HDL Ratio: 4.4 Ratio

## 2013-06-09 LAB — PSA: PSA: 4.15 ng/mL — ABNORMAL HIGH (ref <=4.00)

## 2013-06-09 NOTE — Telephone Encounter (Signed)
Left message for pt to return my call to discuss lab work.

## 2013-06-10 ENCOUNTER — Encounter: Payer: Self-pay | Admitting: Internal Medicine

## 2013-06-10 ENCOUNTER — Encounter: Payer: Self-pay | Admitting: Family

## 2013-06-10 DIAGNOSIS — R972 Elevated prostate specific antigen [PSA]: Secondary | ICD-10-CM | POA: Insufficient documentation

## 2013-06-10 MED ORDER — CIPROFLOXACIN HCL 500 MG PO TABS: 500.0000 mg | ORAL_TABLET | Freq: Two times a day (BID) | ORAL | Status: DC

## 2013-06-10 NOTE — Telephone Encounter (Signed)
Spoke to pt. Reviewed mild elevation of PSA.  Advised pt to take cipro x 10 days.  Follow up in 1 month for follow up PSA.  If still elevated at that time plan referral to urology. He verbalizes understanding.

## 2013-06-10 NOTE — Telephone Encounter (Signed)
Left msg on vm returning [facility] call concerning his labs. Can call [phone]...lmb

## 2013-07-12 ENCOUNTER — Telehealth: Payer: Self-pay | Admitting: Family

## 2013-07-12 DIAGNOSIS — R972 Elevated prostate specific antigen [PSA]: Secondary | ICD-10-CM

## 2013-07-12 NOTE — Telephone Encounter (Signed)
Please call pt and let him know that his PSA has come down slightly.  Now in the upper normal rang.  Lets have him return to the lab in 3 months to repeat. We will continue to monitor. If it trends back up we will refer to urology.

## 2013-07-13 NOTE — Telephone Encounter (Signed)
Notified pt and he voices understanding. Future lab order placed.

## 2013-07-14 ENCOUNTER — Other Ambulatory Visit: Payer: Self-pay | Admitting: Internal Medicine

## 2013-08-14 ENCOUNTER — Encounter: Payer: Self-pay | Admitting: Family

## 2013-08-15 ENCOUNTER — Encounter: Payer: Self-pay | Admitting: Family

## 2013-08-17 ENCOUNTER — Ambulatory Visit (AMBULATORY_SURGERY_CENTER): Payer: BC Managed Care – PPO | Admitting: *Deleted

## 2013-08-17 ENCOUNTER — Encounter: Payer: Self-pay | Admitting: Internal Medicine

## 2013-08-17 VITALS — Ht 69.0 in | Wt 144.0 lb

## 2013-08-17 DIAGNOSIS — Z1211 Encounter for screening for malignant neoplasm of colon: Secondary | ICD-10-CM

## 2013-08-17 MED ORDER — MOVIPREP 100 G PO SOLR: ORAL | Status: DC

## 2013-08-17 NOTE — Progress Notes (Signed)
Patient denies any allergies to eggs or soy. 

## 2013-08-31 ENCOUNTER — Ambulatory Visit (AMBULATORY_SURGERY_CENTER): Payer: BC Managed Care – PPO | Admitting: Internal Medicine

## 2013-08-31 ENCOUNTER — Encounter: Payer: Self-pay | Admitting: Internal Medicine

## 2013-08-31 VITALS — BP 107/70 | HR 63 | Temp 96.5°F | Resp 17 | Wt 144.0 lb

## 2013-08-31 DIAGNOSIS — D126 Benign neoplasm of colon, unspecified: Secondary | ICD-10-CM

## 2013-08-31 DIAGNOSIS — Z1211 Encounter for screening for malignant neoplasm of colon: Secondary | ICD-10-CM

## 2013-08-31 HISTORY — PX: COLONOSCOPY: SHX174

## 2013-08-31 MED ORDER — SODIUM CHLORIDE 0.9 % IV SOLN: 500.0000 mL | INTRAVENOUS | Status: DC

## 2013-08-31 NOTE — Patient Instructions (Addendum)

## 2013-08-31 NOTE — Op Note (Signed)
[facility] [address] [place], [address]   COLONOSCOPY PROCEDURE REPORT  PATIENT: Thomas Valencia, Thomas Valencia  MR#: [id number] BIRTHDATE: Dec 15, 1961 , 52  yrs. old GENDER: Male ENDOSCOPIST: Beverley Fiedler, MD REFERRED XB:[id number] Peggyann Juba, FNP PROCEDURE DATE:  08/31/2013 PROCEDURE:   Colonoscopy with snare polypectomy First Screening Colonoscopy - Avg.  risk and is 50 yrs.  old or older Yes.  Prior Negative Screening - Now for repeat screening. N/A  History of Adenoma - Now for follow-up colonoscopy & has been > or = to 3 yrs.  N/A  Polyps Removed Today? Yes. ASA CLASS:   Class II INDICATIONS:first colonoscopy and average risk screening. MEDICATIONS: MAC sedation, administered by CRNA and propofol (Diprivan) 250mg  IV  DESCRIPTION OF PROCEDURE:   After the risks benefits and alternatives of the procedure were thoroughly explained, informed consent was obtained.  A digital rectal exam revealed no rectal mass.   The LB [id number]  endoscope was introduced through the anus and advanced to the cecum, which was identified by both the appendix and ileocecal valve. No adverse events experienced. The quality of the prep was good, using MoviPrep  The instrument was then slowly withdrawn as the colon was fully examined.  COLON FINDINGS: A semi-pedunculated polyp measuring 7-8 mm in size was found in the ascending colon.  A polypectomy was performed using snare cautery.  The resection was complete and the polyp tissue was completely retrieved.   The colon mucosa was otherwise normal.  Retroflexed views revealed no abnormalities. The time to cecum=2 minutes 35 seconds.  Withdrawal time=8 minutes 41 seconds. The scope was withdrawn and the procedure completed. COMPLICATIONS: There were no complications.  ENDOSCOPIC IMPRESSION: 1.   Semi-pedunculated polyp measuring 7-8 mm in size was found in the ascending colon; polypectomy was performed using snare cautery 2.   The  colon mucosa was otherwise normal  RECOMMENDATIONS: 1.  Hold aspirin, aspirin products, and anti-inflammatory medication for 2 weeks. 2.  Await pathology results 3.  Repeat Colonoscopy in 5 years. 4.  You will receive a letter within 1-2 weeks with the results of your biopsy as well as final recommendations.  Please call my office if you have not received a letter after 3 weeks.   eSigned:  Beverley Fiedler, MD 08/31/2013 8:55 AM  cc: Sandford Craze FNP and The Patient

## 2013-08-31 NOTE — Progress Notes (Signed)
Called to room to assist during endoscopic procedure.  Patient ID and intended procedure confirmed with present staff. Received instructions for my participation in the procedure from the performing physician.  

## 2013-08-31 NOTE — Progress Notes (Signed)
Patient did not experience any of the following events: a burn prior to discharge; a fall within the facility; wrong site/side/patient/procedure/implant event; or a hospital transfer or hospital admission upon discharge from the facility. (G8907) Patient did not have preoperative order for IV antibiotic SSI prophylaxis. (G8918)  

## 2013-09-01 ENCOUNTER — Telehealth: Payer: Self-pay | Admitting: *Deleted

## 2013-09-01 ENCOUNTER — Encounter: Payer: Self-pay | Admitting: Family

## 2013-09-01 DIAGNOSIS — H9319 Tinnitus, unspecified ear: Secondary | ICD-10-CM

## 2013-09-01 NOTE — Telephone Encounter (Signed)
  Follow up Call-  Call back number 08/31/2013  Post procedure Call Back phone  # [phone]  Permission to leave phone message Yes     Patient questions:  Do you have a fever, pain , or abdominal swelling? no Pain Score  0 *  Have you tolerated food without any problems? yes  Have you been able to return to your normal activities? yes  Do you have any questions about your discharge instructions: Diet   no Medications  no Follow up visit  no  Do you have questions or concerns about your Care? no  Actions: * If pain score is 4 or above: No action needed, pain <4.

## 2013-09-03 ENCOUNTER — Encounter: Payer: Self-pay | Admitting: Internal Medicine

## 2013-09-21 ENCOUNTER — Encounter: Payer: Self-pay | Admitting: Family

## 2013-09-21 MED ORDER — BECLOMETHASONE DIPROPIONATE 80 MCG/ACT NA AERS: 2.0000 | INHALATION_SPRAY | Freq: Every day | NASAL | Status: DC

## 2013-09-29 ENCOUNTER — Encounter: Payer: Self-pay | Admitting: Family

## 2013-09-29 NOTE — Telephone Encounter (Signed)
Please advise 

## 2013-11-05 ENCOUNTER — Encounter: Payer: Self-pay | Admitting: Family

## 2013-11-16 MED ORDER — EFINACONAZOLE 10 % EX SOLN: CUTANEOUS | Status: DC

## 2013-11-16 NOTE — Addendum Note (Signed)
Addended by: Sandford Craze on: 11/16/2013 01:10 PM   Modules accepted: Orders

## 2014-07-12 ENCOUNTER — Other Ambulatory Visit: Payer: Self-pay | Admitting: Family

## 2014-07-12 ENCOUNTER — Encounter: Payer: Self-pay | Admitting: Family

## 2014-07-12 ENCOUNTER — Ambulatory Visit (INDEPENDENT_AMBULATORY_CARE_PROVIDER_SITE_OTHER): Payer: BC Managed Care – PPO | Admitting: Family

## 2014-07-12 VITALS — BP 100/70 | HR 81 | Temp 98.4°F | Resp 16 | Ht 69.0 in | Wt 145.1 lb

## 2014-07-12 DIAGNOSIS — J3089 Other allergic rhinitis: Secondary | ICD-10-CM

## 2014-07-12 DIAGNOSIS — J309 Allergic rhinitis, unspecified: Secondary | ICD-10-CM

## 2014-07-12 DIAGNOSIS — Z0000 Encounter for general adult medical examination without abnormal findings: Secondary | ICD-10-CM

## 2014-07-12 DIAGNOSIS — D333 Benign neoplasm of cranial nerves: Secondary | ICD-10-CM

## 2014-07-12 LAB — CBC WITH DIFFERENTIAL/PLATELET
Basophils Absolute: 0.1 10*3/uL (ref 0.0–0.1)
Basophils Relative: 1 % (ref 0–1)
EOS ABS: 0.3 10*3/uL (ref 0.0–0.7)
EOS PCT: 5 % (ref 0–5)
HCT: 41.1 % (ref 39.0–52.0)
HEMOGLOBIN: 14 g/dL (ref 13.0–17.0)
LYMPHS ABS: 1.3 10*3/uL (ref 0.7–4.0)
LYMPHS PCT: 22 % (ref 12–46)
MCH: 29.1 pg (ref 26.0–34.0)
MCHC: 34.1 g/dL (ref 30.0–36.0)
MCV: 85.4 fL (ref 78.0–100.0)
MONOS PCT: 9 % (ref 3–12)
Monocytes Absolute: 0.5 10*3/uL (ref 0.1–1.0)
Neutro Abs: 3.7 10*3/uL (ref 1.7–7.7)
Neutrophils Relative %: 63 % (ref 43–77)
PLATELETS: 237 10*3/uL (ref 150–400)
RBC: 4.81 MIL/uL (ref 4.22–5.81)
RDW: 13.6 % (ref 11.5–15.5)
WBC: 5.9 10*3/uL (ref 4.0–10.5)

## 2014-07-12 MED ORDER — MONTELUKAST SODIUM 10 MG PO TABS: 10.0000 mg | ORAL_TABLET | Freq: Every day | ORAL | Status: DC

## 2014-07-12 NOTE — Progress Notes (Signed)
Subjective:    Patient ID: Thomas Valencia, male    DOB: 01/30/1961, 53 y.o.   MRN: [id number]  HPI  Thomas Valencia is a 53 yr old male who presents today for cpx.     Immunizations:up to date Diet:reports healthy diet Exercise: not regular Colonoscopy:2014  Reports Nocturia x 1  Nasal congestion-  Using qnasal only every other day due to nose bleeds.  Generic claritin D helps.  Overall, still feels that symptoms are not optimally controlled.  Vestibular schwannoma- right ear.  Has hearing aid on right due to subsequent diminished hearing in that ear. He is following with ENT, scheduled for another MRI to monitor. Reports decreased hearing in the right ear.    Review of Systems  Constitutional: Negative for unexpected weight change.       Wt Readings from Last 3 Encounters: 07/12/14 : 145 lb 1.3 oz (65.808 kg) 08/31/13 : 144 lb (65.318 kg) 08/17/13 : 144 lb (65.318 kg)   HENT: Positive for postnasal drip.   Respiratory: Negative for cough and shortness of breath.   Cardiovascular: Negative for chest pain.  Gastrointestinal: Negative for nausea, vomiting, diarrhea and constipation.  Genitourinary: Negative for frequency and hematuria.  Musculoskeletal: Negative for arthralgias and joint swelling.  Skin: Negative for rash.  Neurological:       Some sinus headaches  Hematological: Negative for adenopathy.  Psychiatric/Behavioral:       Denies depression/anxiey       Past Medical History  Diagnosis Date  . Hypercholesteremia   . Hearing loss   . Tinnitus, right   . Rhinitis   . Vestibular schwannoma 12/24/12    History   Social History  . Marital Status: Married    Spouse Name: N/A    Number of Children: N/A  . Years of Education: N/A   Occupational History  . Not on file.   Social History Main Topics  . Smoking status: Never Smoker   . Smokeless tobacco: Never Used  . Alcohol Use: 2.5 oz/week    5 drink(s) per week  . Drug Use: No  . Sexual Activity:  Not on file   Other Topics Concern  . 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 No past surgeries      denies surgical history  . Wisdom tooth extraction      Family History  Problem Relation Age of Onset  . Hyperlipidemia Mother   . Hypertension Mother   . Hyperlipidemia Brother   . Other Neg Hx     colon ca, prostate ca  . Colon cancer Neg Hx   . Hyperlipidemia Father     No Known Allergies  Current Outpatient Prescriptions on File Prior to Visit  Medication Sig Dispense Refill  . Efinaconazole (JUBLIA) 10 % SOLN Apply one drop to each affected toenail one daily for 48 weeks  4 mL  11  . simvastatin (ZOCOR) 40 MG tablet TAKE 1 TABLET (40 MG TOTAL) BY MOUTH AT BEDTIME.  90 tablet  3   No current facility-administered medications on file prior to visit.    BP 100/70  Pulse 81  Temp(Src) 98.4 F (36.9 C) (Oral)  Resp 16  Ht 5\' 9"  (1.753 m)  Wt  145 lb 1.3 oz (65.808 kg)  BMI 21.41 kg/m2  SpO2 95%    Objective:   Physical Exam  Physical Exam  Constitutional: He is oriented to person, place, and time. He appears well-developed and well-nourished. No distress.  HENT:  Head: Normocephalic and atraumatic.  Right Ear: Tympanic membrane and ear canal normal.  Left Ear: Tympanic membrane and ear canal normal.  Mouth/Throat: Oropharynx is clear and moist.  Eyes: Pupils are equal, round, and reactive to light. No scleral icterus.  Neck: Normal range of motion. No thyromegaly present.  Cardiovascular: Normal rate and regular rhythm.   No murmur heard. Pulmonary/Chest: Effort normal and breath sounds normal. No respiratory distress. He has no wheezes. He has no rales. He exhibits no tenderness.  Abdominal: Soft. Bowel sounds are normal. He exhibits no  distension and no mass. There is no tenderness. There is no rebound and no guarding.  Musculoskeletal: He exhibits no edema.  Lymphadenopathy:    He has no cervical adenopathy.  Neurological: He is alert and oriented to person, place, and time. He has normal reflexes. He exhibits normal muscle tone. Coordination normal.  Skin: Skin is warm and dry.  Psychiatric: He has a normal mood and affect. His behavior is normal. Judgment and thought content normal.  GU:  Small, smooth prostate noted. Normal rectal exam        Assessment & Plan:          Assessment & Plan:

## 2014-07-12 NOTE — Patient Instructions (Addendum)
Try to get regular exercise (30 minutes 5 days a week) Complete lab work prior to leaving.   Start singulair, Follow up in 6 months.

## 2014-07-12 NOTE — Progress Notes (Signed)
Pre visit review using our clinic review tool, if applicable. No additional management support is needed unless otherwise documented below in the visit note. 

## 2014-07-12 NOTE — Assessment & Plan Note (Signed)
We discussed increasing exercise.  Obtain fasting labs today, continue healthy diet.

## 2014-07-12 NOTE — Assessment & Plan Note (Signed)
Deteriorated. Continue claritin D, qnasal, add singulair.

## 2014-07-13 LAB — URINALYSIS, ROUTINE W REFLEX MICROSCOPIC
Bilirubin Urine: NEGATIVE
Glucose, UA: NEGATIVE mg/dL
Hgb urine dipstick: NEGATIVE
Ketones, ur: NEGATIVE mg/dL
LEUKOCYTES UA: NEGATIVE
NITRITE: NEGATIVE
PROTEIN: NEGATIVE mg/dL
Specific Gravity, Urine: 1.024 (ref 1.005–1.030)
Urobilinogen, UA: 0.2 mg/dL (ref 0.0–1.0)
pH: 5.5 (ref 5.0–8.0)

## 2014-07-13 LAB — HEPATIC FUNCTION PANEL
ALK PHOS: 48 U/L (ref 39–117)
ALT: 19 U/L (ref 0–53)
AST: 19 U/L (ref 0–37)
Albumin: 4.4 g/dL (ref 3.5–5.2)
BILIRUBIN INDIRECT: 0.5 mg/dL (ref 0.2–1.2)
BILIRUBIN TOTAL: 0.6 mg/dL (ref 0.2–1.2)
Bilirubin, Direct: 0.1 mg/dL (ref 0.0–0.3)
TOTAL PROTEIN: 6.8 g/dL (ref 6.0–8.3)

## 2014-07-13 LAB — LIPID PANEL
CHOL/HDL RATIO: 3 ratio
Cholesterol: 173 mg/dL (ref 0–200)
HDL: 57 mg/dL (ref >39)
LDL CALC: 102 mg/dL — AB (ref 0–99)
Triglycerides: 71 mg/dL (ref <150)
VLDL: 14 mg/dL (ref 0–40)

## 2014-07-13 LAB — BASIC METABOLIC PANEL WITH GFR
BUN: 17 mg/dL (ref 6–23)
CALCIUM: 9 mg/dL (ref 8.4–10.5)
CHLORIDE: 102 meq/L (ref 96–112)
CO2: 27 meq/L (ref 19–32)
Creat: 0.88 mg/dL (ref 0.50–1.35)
GFR, Est African American: >89 mL/min
GFR, Est Non African American: >89 mL/min
GLUCOSE: 93 mg/dL (ref 70–99)
POTASSIUM: 4.6 meq/L (ref 3.5–5.3)
SODIUM: 140 meq/L (ref 135–145)

## 2014-07-13 LAB — TSH: TSH: 2.995 u[IU]/mL (ref 0.350–4.500)

## 2014-07-13 LAB — PSA: PSA: 3.26 ng/mL (ref <=4.00)

## 2014-07-14 ENCOUNTER — Telehealth: Payer: Self-pay

## 2014-07-14 NOTE — Telephone Encounter (Signed)
Msg was given to lab Tech. Test will be added on to sample as requested per NP

## 2014-07-15 LAB — PSA: PSA: 3.63 ng/mL (ref <=4.00)

## 2014-09-20 ENCOUNTER — Other Ambulatory Visit: Payer: Self-pay | Admitting: Family

## 2014-09-27 ENCOUNTER — Other Ambulatory Visit: Payer: Self-pay | Admitting: Family

## 2014-09-27 MED ORDER — BECLOMETHASONE DIPROPIONATE 80 MCG/ACT NA AERS: 2.0000 | INHALATION_SPRAY | Freq: Every day | NASAL | Status: DC

## 2014-11-29 ENCOUNTER — Other Ambulatory Visit: Payer: Self-pay

## 2014-11-29 MED ORDER — EFINACONAZOLE 10 % EX SOLN: CUTANEOUS | Status: DC

## 2014-11-29 MED ORDER — BECLOMETHASONE DIPROPIONATE 80 MCG/ACT NA AERS
2.0000 | INHALATION_SPRAY | Freq: Every day | NASAL | Status: DC
Start: 2014-11-29 — End: 2015-02-10

## 2015-01-10 ENCOUNTER — Ambulatory Visit: Payer: BC Managed Care – PPO | Admitting: Family

## 2015-01-17 ENCOUNTER — Encounter: Payer: Self-pay | Admitting: Family

## 2015-01-17 ENCOUNTER — Ambulatory Visit (INDEPENDENT_AMBULATORY_CARE_PROVIDER_SITE_OTHER): Payer: BLUE CROSS/BLUE SHIELD | Admitting: Family

## 2015-01-17 VITALS — BP 129/84 | HR 71 | Temp 97.9°F | Resp 16 | Ht 69.0 in | Wt 151.0 lb

## 2015-01-17 DIAGNOSIS — E7800 Pure hypercholesterolemia, unspecified: Secondary | ICD-10-CM

## 2015-01-17 DIAGNOSIS — J309 Allergic rhinitis, unspecified: Secondary | ICD-10-CM

## 2015-01-17 DIAGNOSIS — E785 Hyperlipidemia, unspecified: Secondary | ICD-10-CM

## 2015-01-17 LAB — LIPID PANEL
Cholesterol: 165 mg/dL (ref 0–200)
HDL: 44.5 mg/dL (ref >39.00)
LDL CALC: 85 mg/dL (ref 0–99)
NonHDL: 120.5
TRIGLYCERIDES: 180 mg/dL — AB (ref 0.0–149.0)
Total CHOL/HDL Ratio: 4
VLDL: 36 mg/dL (ref 0.0–40.0)

## 2015-01-17 MED ORDER — AZELASTINE-FLUTICASONE 137-50 MCG/ACT NA SUSP: 1.0000 | Freq: Two times a day (BID) | NASAL | Status: DC

## 2015-01-17 NOTE — Assessment & Plan Note (Addendum)
Uncontrolled. D/c qnasal and change to dymista, continue oral antihistamine. Consider abx if symptoms worsen or do not improve.

## 2015-01-17 NOTE — Progress Notes (Addendum)
Subjective:    Patient ID: Thomas Valencia, male    DOB: 04-03-1961, 54 y.o.   MRN: [id number]  HPI  Thomas Valencia is a 54 yr old male who presents today for follow up on his cholesterol. Patient is currently maintained on the following medication for hyperlipidemia: zocor Last lipid panel as follows: Patient denies myalgia. Patient reports good compliance with low fat/low cholesterol diet.   Lab Results  Component Value Date   CHOL 173 07/12/2014   HDL 57 07/12/2014   LDLCALC 102* 07/12/2014   LDLDIRECT 140.0 08/13/2008   TRIG 71 07/12/2014   CHOLHDL 3.0 07/12/2014   Reports chronic rhinitis- reports that he had green nasal drainage but now is is mainly clear in color. .  Reports chronic sinus pressure.  He is using claritin or zyrtec without much improvement, also using qnasal     Review of Systems See HPI  Past Medical History  Diagnosis Date  . Hypercholesteremia   . Hearing loss   . Tinnitus, right   . Rhinitis   . Vestibular schwannoma 12/24/12    History   Social History  . Marital Status: Married    Spouse Name: N/A    Number of Children: N/A  . Years of Education: N/A   Occupational History  . Not on file.   Social History Main Topics  . Smoking status: Never Smoker   . Smokeless tobacco: Never Used  . Alcohol Use: 2.5 oz/week    5 drink(s) per week  . Drug Use: No  . Sexual Activity: Not on file   Other Topics Concern  . 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 No past surgeries      denies surgical history  . Wisdom tooth extraction      Family History  Problem Relation Age of Onset  . Hyperlipidemia Mother   . Hypertension Mother   . Hyperlipidemia Brother   . Other Neg Hx     colon ca, prostate ca  . Colon  cancer Neg Hx   . Hyperlipidemia Father     No Known Allergies  Current Outpatient Prescriptions on File Prior to Visit  Medication Sig Dispense Refill  . Beclomethasone Dipropionate 80 MCG/ACT AERS Place 2 sprays into the nose daily. PT USES EVERY OTHER DAY 8.7 g 2  . Efinaconazole (JUBLIA) 10 % SOLN Apply one drop to each affected toenail one daily for 48 weeks 4 mL 2  . montelukast (SINGULAIR) 10 MG tablet Take 1 tablet (10 mg total) by mouth at bedtime. 30 tablet 5  . simvastatin (ZOCOR) 40 MG tablet TAKE 1 TABLET (40 MG TOTAL) BY MOUTH AT BEDTIME. 90 tablet 1   No current facility-administered medications on file prior to visit.    BP 129/84 mmHg  Pulse 71  Temp(Src) 97.9 F (36.6 C) (Oral)  Resp 16  Ht 5\' 9"  (1.753 m)  Wt 151 lb (68.493 kg)  BMI 22.29 kg/m2  SpO2 100%       Objective:   Physical Exam  Constitutional: He is oriented to person, place, and time. He appears well-developed and well-nourished. No distress.  HENT:  Head: Normocephalic and atraumatic.  Right Ear: Tympanic membrane and ear canal normal.  Left  Ear: Tympanic membrane and ear canal normal.  Mouth/Throat: No oropharyngeal exudate, posterior oropharyngeal edema or posterior oropharyngeal erythema.  Cardiovascular: Normal rate and regular rhythm.   No murmur heard. Pulmonary/Chest: Effort normal and breath sounds normal. No respiratory distress. He has no wheezes. He has no rales. He exhibits no tenderness.  Neurological: He is alert and oriented to person, place, and time.  Skin: Skin is warm and dry.  Psychiatric: He has a normal mood and affect. His behavior is normal. Judgment and thought content normal.          Assessment & Plan:

## 2015-01-17 NOTE — Addendum Note (Signed)
Addended by: Debbrah Alar on: 01/17/2015 01:48 PM   Modules accepted: Miquel Dunn

## 2015-01-17 NOTE — Patient Instructions (Addendum)
Please complete lab work prior to leaving. Stop qnasal, start dymista. Call if your nasal symptoms worsen or do not improve.  Follow up in August for routine physical.

## 2015-01-17 NOTE — Assessment & Plan Note (Signed)
Tolerating statin, obtain follow up flp.

## 2015-01-19 ENCOUNTER — Telehealth: Payer: Self-pay | Admitting: Family

## 2015-01-19 DIAGNOSIS — E785 Hyperlipidemia, unspecified: Secondary | ICD-10-CM

## 2015-01-19 NOTE — Telephone Encounter (Signed)
See mychart.  

## 2015-01-28 ENCOUNTER — Other Ambulatory Visit: Payer: Self-pay | Admitting: Family

## 2015-02-09 ENCOUNTER — Encounter: Payer: Self-pay | Admitting: Family

## 2015-02-10 ENCOUNTER — Ambulatory Visit (HOSPITAL_BASED_OUTPATIENT_CLINIC_OR_DEPARTMENT_OTHER)
Admission: RE | Admit: 2015-02-10 | Discharge: 2015-02-10 | Disposition: A | Discharge status: Home / Self Care | Payer: BLUE CROSS/BLUE SHIELD | Source: Ambulatory Visit | Attending: Family | Admitting: Family

## 2015-02-10 ENCOUNTER — Encounter: Payer: Self-pay | Admitting: Family

## 2015-02-10 ENCOUNTER — Ambulatory Visit (INDEPENDENT_AMBULATORY_CARE_PROVIDER_SITE_OTHER): Payer: BLUE CROSS/BLUE SHIELD | Admitting: Family

## 2015-02-10 VITALS — BP 120/78 | HR 82 | Temp 97.8°F | Ht 69.0 in | Wt 153.8 lb

## 2015-02-10 DIAGNOSIS — R0781 Pleurodynia: Secondary | ICD-10-CM

## 2015-02-10 DIAGNOSIS — W19XXXA Unspecified fall, initial encounter: Secondary | ICD-10-CM | POA: Diagnosis not present

## 2015-02-10 NOTE — Progress Notes (Signed)
Subjective:    Patient ID: Thomas Valencia, male    DOB: [birth year], 54 y.o.   MRN: [id number]  HPI [name] is here today with chief complaint of pain in left mid back after a fall last Thursday. He hit left back/ribs very hard when he slipped down the stairs. He was in [place] and went to [facility] and they didn't do imaging but they did give him diclofenac which helped the pain considerably.[name] He reports feeling much better today and only notices the pain when he sneezes. Does report a clicking sound with deep inspiration on the left.  Denies associated SOB, numbness/tingling left arm. Review of Systems  Respiratory: Negative for shortness of breath.    Past Medical History  Diagnosis Date  . Hypercholesteremia   . Hearing loss   . Tinnitus, right   . Rhinitis   . Vestibular schwannoma 12/24/12    History   Social History  . Marital Status: Married    Spouse Name: N/A  . Number of Children: N/A  . Years of Education: N/A   Occupational History  . Not on file.   Social History Main Topics  . Smoking status: Never Smoker   . Smokeless tobacco: Never Used  . Alcohol Use: 2.5 oz/week    5 drink(s) per week  . Drug Use: No  . Sexual Activity: Not on file   Other Topics Concern  . 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 No past surgeries      denies surgical history  . Wisdom tooth extraction      Family History  Problem Relation Age of Onset  . Hyperlipidemia Mother   . Hypertension Mother   . Hyperlipidemia Brother   . Other Neg Hx     colon ca, prostate ca  . Colon cancer Neg Hx   . Hyperlipidemia Father     No Known Allergies  Current Outpatient Prescriptions on File Prior to Visit  Medication Sig Dispense Refill  .  Azelastine-Fluticasone (DYMISTA) 137-50 MCG/ACT SUSP Place 1 spray into the nose 2 (two) times daily. 23 g 2  . montelukast (SINGULAIR) 10 MG tablet TAKE 1 TABLET (10 MG TOTAL) BY MOUTH AT BEDTIME. 30 tablet 5  . Omega-3 Fatty Acids (FISH OIL) 1000 MG CAPS Take 2,000 mg by mouth 2 (two) times daily.    . simvastatin (ZOCOR) 40 MG tablet TAKE 1 TABLET (40 MG TOTAL) BY MOUTH AT BEDTIME. 90 tablet 1   No current facility-administered medications on file prior to visit.    BP 120/78 mmHg  Pulse 82  Temp(Src) 97.8 F (36.6 C) (Oral)  Ht 5\' 9"  (1.753 m)  Wt 153 lb 12.8 oz (69.763 kg)  BMI 22.70 kg/m2  SpO2 97%       Objective:   Physical Exam  Constitutional: He is oriented to person, place, and time. He appears well-developed and well-nourished. No distress.  HENT:  Head: Normocephalic and atraumatic.  Cardiovascular: Normal rate and regular rhythm.   No murmur heard. Pulmonary/Chest: Effort normal and breath sounds normal. No respiratory distress. He has no wheezes. He has no rales.  Musculoskeletal: He exhibits no edema.       Arms: Tenderness to palpation overlying left lower  posterior rib cage  Neurological: He is alert and oriented to person, place, and time.  Skin: Skin is warm and dry.  Psychiatric: He has a normal mood and affect. His behavior is normal. Thought content normal.          Assessment & Plan:

## 2015-02-10 NOTE — Assessment & Plan Note (Signed)
Suspect rib fracture.  Clinically improving. I want to make sure that he does not have pneumothorax. Obtain CXR with left rib detail.

## 2015-02-10 NOTE — Patient Instructions (Signed)
Please complete chest/rib x ray on the first floor.

## 2015-02-10 NOTE — Progress Notes (Signed)
Pre visit review using our clinic review tool, if applicable. No additional management support is needed unless otherwise documented below in the visit note. 

## 2015-02-11 ENCOUNTER — Ambulatory Visit: Payer: Self-pay | Admitting: Family

## 2015-03-11 ENCOUNTER — Other Ambulatory Visit: Payer: Self-pay | Admitting: Family

## 2015-04-03 ENCOUNTER — Other Ambulatory Visit: Payer: Self-pay | Admitting: Family

## 2015-04-07 ENCOUNTER — Other Ambulatory Visit: Payer: Self-pay | Admitting: Family

## 2015-05-30 ENCOUNTER — Encounter: Payer: Self-pay | Admitting: Family

## 2015-06-03 ENCOUNTER — Ambulatory Visit: Payer: Self-pay | Admitting: Family

## 2015-06-16 ENCOUNTER — Encounter: Payer: Self-pay | Admitting: Family

## 2015-06-16 MED ORDER — AZELASTINE-FLUTICASONE 137-50 MCG/ACT NA SUSP: 1.0000 | Freq: Two times a day (BID) | NASAL | Status: DC

## 2015-06-24 ENCOUNTER — Telehealth: Payer: Self-pay | Admitting: Behavioral Health

## 2015-06-24 NOTE — Telephone Encounter (Signed)
Returned patient's call from earlier, however unable to reach patient again. Left message for patient to return call when available.

## 2015-06-24 NOTE — Telephone Encounter (Signed)
Patient returned phone call. Best [phone] after 3.00pm

## 2015-06-24 NOTE — Telephone Encounter (Signed)
Unable to reach patient at time of Pre-Visit Call.  Left message for patient to return call when available.    

## 2015-06-28 ENCOUNTER — Encounter: Payer: BLUE CROSS/BLUE SHIELD | Admitting: Family

## 2015-07-07 ENCOUNTER — Other Ambulatory Visit: Payer: Self-pay | Admitting: Family

## 2015-07-08 NOTE — Telephone Encounter (Signed)
Last filled:  04/07/15 Amt: 90, 0 Last OV:  01/17/15 Next appt:  08/05/15  Med filled x 30 days.

## 2015-08-04 ENCOUNTER — Telehealth: Payer: Self-pay | Admitting: Behavioral Health

## 2015-08-04 ENCOUNTER — Encounter: Payer: Self-pay | Admitting: Behavioral Health

## 2015-08-04 NOTE — Telephone Encounter (Signed)
Pre-Visit Call completed with patient and chart updated.   Pre-Visit Info documented in Specialty Comments under [employer].    

## 2015-08-05 ENCOUNTER — Encounter: Payer: Self-pay | Admitting: Family

## 2015-08-05 ENCOUNTER — Ambulatory Visit (INDEPENDENT_AMBULATORY_CARE_PROVIDER_SITE_OTHER): Payer: BLUE CROSS/BLUE SHIELD | Admitting: Family

## 2015-08-05 VITALS — BP 110/84 | HR 73 | Temp 98.4°F | Ht 68.5 in | Wt 153.1 lb

## 2015-08-05 DIAGNOSIS — Z0000 Encounter for general adult medical examination without abnormal findings: Secondary | ICD-10-CM | POA: Diagnosis not present

## 2015-08-05 DIAGNOSIS — J309 Allergic rhinitis, unspecified: Secondary | ICD-10-CM

## 2015-08-05 LAB — CBC WITH DIFFERENTIAL/PLATELET
BASOS ABS: 0 10*3/uL (ref 0.0–0.1)
Basophils Relative: 0.6 % (ref 0.0–3.0)
EOS ABS: 0.4 10*3/uL (ref 0.0–0.7)
EOS PCT: 5.4 % — AB (ref 0.0–5.0)
HCT: 43 % (ref 39.0–52.0)
Hemoglobin: 14.3 g/dL (ref 13.0–17.0)
Lymphocytes Relative: 27.5 % (ref 12.0–46.0)
Lymphs Abs: 1.8 10*3/uL (ref 0.7–4.0)
MCHC: 33.4 g/dL (ref 30.0–36.0)
MCV: 87.3 fl (ref 78.0–100.0)
MONOS PCT: 8.2 % (ref 3.0–12.0)
Monocytes Absolute: 0.5 10*3/uL (ref 0.1–1.0)
NEUTROS PCT: 58.3 % (ref 43.0–77.0)
Neutro Abs: 3.8 10*3/uL (ref 1.4–7.7)
Platelets: 215 10*3/uL (ref 150.0–400.0)
RBC: 4.92 Mil/uL (ref 4.22–5.81)
RDW: 13.1 % (ref 11.5–15.5)
WBC: 6.6 10*3/uL (ref 4.0–10.5)

## 2015-08-05 LAB — BASIC METABOLIC PANEL
BUN: 23 mg/dL (ref 6–23)
CALCIUM: 9.4 mg/dL (ref 8.4–10.5)
CO2: 32 mEq/L (ref 19–32)
Chloride: 102 mEq/L (ref 96–112)
Creatinine, Ser: 0.89 mg/dL (ref 0.40–1.50)
GFR: 94.99 mL/min (ref >60.00)
Glucose, Bld: 91 mg/dL (ref 70–99)
Potassium: 4.1 mEq/L (ref 3.5–5.1)
SODIUM: 139 meq/L (ref 135–145)

## 2015-08-05 LAB — URINALYSIS, ROUTINE W REFLEX MICROSCOPIC
Bilirubin Urine: NEGATIVE
Hgb urine dipstick: NEGATIVE
KETONES UR: NEGATIVE
Nitrite: NEGATIVE
PH: 5.5 (ref 5.0–8.0)
SPECIFIC GRAVITY, URINE: 1.02 (ref 1.000–1.030)
Total Protein, Urine: NEGATIVE
URINE GLUCOSE: NEGATIVE
Urobilinogen, UA: 0.2 (ref 0.0–1.0)

## 2015-08-05 LAB — LIPID PANEL
Cholesterol: 207 mg/dL — ABNORMAL HIGH (ref 0–200)
HDL: 48.6 mg/dL (ref >39.00)
LDL Cholesterol: 128 mg/dL — ABNORMAL HIGH (ref 0–99)
NONHDL: 157.92
Total CHOL/HDL Ratio: 4
Triglycerides: 152 mg/dL — ABNORMAL HIGH (ref 0.0–149.0)
VLDL: 30.4 mg/dL (ref 0.0–40.0)

## 2015-08-05 LAB — HEPATIC FUNCTION PANEL
ALK PHOS: 54 U/L (ref 39–117)
ALT: 25 U/L (ref 0–53)
AST: 20 U/L (ref 0–37)
Albumin: 4.5 g/dL (ref 3.5–5.2)
BILIRUBIN DIRECT: 0.1 mg/dL (ref 0.0–0.3)
Total Bilirubin: 0.8 mg/dL (ref 0.2–1.2)
Total Protein: 7.3 g/dL (ref 6.0–8.3)

## 2015-08-05 LAB — TSH: TSH: 4.02 u[IU]/mL (ref 0.35–4.50)

## 2015-08-05 MED ORDER — SIMVASTATIN 40 MG PO TABS: 40.0000 mg | ORAL_TABLET | Freq: Every day | ORAL | Status: DC

## 2015-08-05 NOTE — Patient Instructions (Signed)
Please complete lab work prior to leaving. You will be contacted about your referral to allergist. Try to add back in some regular excerise.

## 2015-08-05 NOTE — Progress Notes (Signed)
Pre visit review using our clinic review tool, if applicable. No additional management support is needed unless otherwise documented below in the visit note. 

## 2015-08-05 NOTE — Assessment & Plan Note (Signed)
Immunizations reviewed and up to date.  Continue healthy diet, advise resume exercise.  Obtain routine labs. EKG today.

## 2015-08-05 NOTE — Assessment & Plan Note (Signed)
Uncontrolled. Advised trying to wean off of chronic afrin use due to rebound swelling. Refer to allergist, ? If he may benefit from allergy shots.

## 2015-08-05 NOTE — Progress Notes (Signed)
Subjective:    Patient ID: Thomas Valencia, male    DOB: [birth year], 54 y.o.   MRN: [id number]  HPI  Patient presents today for complete physical.  Immunizations: Tdap up to date Diet: reports healthy diet Exercise: not exercising.  Colonoscopy:  Due 2019 Dental:  Up to date Eye:  Annual- up to date  Wt Readings from Last 3 Encounters:  08/05/15 153 lb 2 oz (69.457 kg)  02/10/15 153 lb 12.8 oz (69.763 kg)  01/17/15 151 lb (68.493 kg)   Allergic rhinitis- uses zyrtec or allegra daily.    Review of Systems  Constitutional: Negative for unexpected weight change.  HENT: Positive for rhinorrhea.        Vestibular schwanoma right ear-   Eyes: Negative for visual disturbance.  Respiratory: Negative.  Negative for cough and shortness of breath.   Cardiovascular: Negative for chest pain.  Gastrointestinal: Negative for diarrhea, constipation and blood in stool.  Genitourinary: Positive for frequency. Negative for dysuria.  Musculoskeletal: Negative for myalgias and arthralgias.  Skin: Negative for rash.  Neurological: Negative for headaches.  Hematological: Negative for adenopathy.  Psychiatric/Behavioral:       Denies depression/anxiety   Past Medical History  Diagnosis Date  . Hypercholesteremia   . Hearing loss   . Tinnitus, right   . Rhinitis   . Vestibular schwannoma 12/24/12    Social History   Social History  . Marital Status: Married    Spouse Name: N/A  . Number of Children: N/A  . Years of Education: N/A   Occupational History  . Not on file.   Social History Main Topics  . Smoking status: Never Smoker   . Smokeless tobacco: Never Used  . Alcohol Use: 2.5 oz/week    5 drink(s) per week  . Drug Use: No  . Sexual Activity: Not on file   Other Topics Concern  . 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 No past surgeries      denies surgical history  . Wisdom tooth extraction      Family History  Problem Relation Age of Onset  . Hyperlipidemia Mother   . Hypertension Mother   . Hyperlipidemia Brother   . Other Neg Hx     colon ca, prostate ca  . Colon cancer Neg Hx   . Hyperlipidemia Father     No Known Allergies  Current Outpatient Prescriptions on File Prior to Visit  Medication Sig Dispense Refill  . Azelastine-Fluticasone (DYMISTA) 137-50 MCG/ACT SUSP Place 1 spray into the nose 2 (two) times daily. 23 g 2  . JUBLIA 10 % SOLN APP AA OF TOENAILS QD  5  . Omega-3 Fatty Acids (FISH OIL) 1000 MG CAPS Take 2,000 mg by mouth 2 (two) times daily.    . Oxymetazoline HCl (AFRIN 12 HOUR NA) Place into the nose at bedtime.    . simvastatin (ZOCOR) 40 MG tablet TAKE 1 TABLET BY MOUTH EVERY DAY 30 tablet 0   No current facility-administered medications on file prior to visit.    BP 110/84 mmHg  Pulse 73  Temp(Src) 98.4 F (36.9 C) (Oral)  Ht 5' 8.5" (1.74 m)  Wt 153 lb 2 oz (69.457 kg)  BMI 22.94  kg/m2  SpO2 97%       Objective:   Physical Exam  Physical Exam  Constitutional: He is oriented to person, place, and time. He appears well-developed and well-nourished. No distress.  HENT:  Head: Normocephalic and atraumatic.  Right Ear: Tympanic membrane and ear canal normal.  Left Ear: Tympanic membrane and ear canal normal.  Mouth/Throat: Oropharynx is clear and moist.  Eyes: Pupils are equal, round, and reactive to light. No scleral icterus.  Neck: Normal range of motion. No thyromegaly present.  Cardiovascular: Normal rate and regular rhythm.   No murmur heard. Pulmonary/Chest: Effort normal and breath sounds normal. No respiratory distress. He has no wheezes. He has no rales. He exhibits no tenderness.  Abdominal: Soft. Bowel sounds are normal. He exhibits no distension and no mass. There is no  tenderness. There is no rebound and no guarding.  Musculoskeletal: He exhibits no edema.  Lymphadenopathy:    He has no cervical adenopathy.  Neurological: He is alert and oriented to person, place, and time. He has normal patellar reflexes. He exhibits normal muscle tone. Coordination normal.  Skin: Skin is warm and dry.  Psychiatric: He has a normal mood and affect. His behavior is normal. Judgment and thought content normal.          Assessment & Plan:         Assessment & Plan:  EKG tracing is personally reviewed.  EKG notes NSR.  No acute changes.

## 2015-08-07 ENCOUNTER — Encounter: Payer: Self-pay | Admitting: Family

## 2015-08-24 ENCOUNTER — Other Ambulatory Visit: Payer: Self-pay | Admitting: Family

## 2015-08-25 NOTE — Telephone Encounter (Signed)
Pt was given a Rx on 08/05/15 for 90 tablets and 1 refill.

## 2015-08-29 ENCOUNTER — Encounter: Payer: Self-pay | Admitting: Family

## 2015-08-30 MED ORDER — SIMVASTATIN 40 MG PO TABS: 40.0000 mg | ORAL_TABLET | Freq: Every day | ORAL | Status: DC

## 2016-01-20 ENCOUNTER — Telehealth: Payer: Self-pay | Admitting: Family

## 2016-01-20 NOTE — Telephone Encounter (Signed)
Left message for patient to call about Flu Shot

## 2016-03-06 ENCOUNTER — Telehealth: Payer: Self-pay | Admitting: Family

## 2016-03-06 NOTE — Telephone Encounter (Signed)
LVM inquiring if patient received flu shot  °

## 2016-04-23 DIAGNOSIS — B351 Tinea unguium: Secondary | ICD-10-CM | POA: Diagnosis not present

## 2016-04-23 DIAGNOSIS — L905 Scar conditions and fibrosis of skin: Secondary | ICD-10-CM | POA: Diagnosis not present

## 2016-04-23 DIAGNOSIS — D225 Melanocytic nevi of trunk: Secondary | ICD-10-CM | POA: Diagnosis not present

## 2016-04-23 DIAGNOSIS — L814 Other melanin hyperpigmentation: Secondary | ICD-10-CM | POA: Diagnosis not present

## 2016-08-06 ENCOUNTER — Encounter: Payer: BLUE CROSS/BLUE SHIELD | Admitting: Family

## 2016-09-04 ENCOUNTER — Encounter: Payer: Self-pay | Admitting: Family

## 2016-09-10 DIAGNOSIS — D333 Benign neoplasm of cranial nerves: Secondary | ICD-10-CM | POA: Diagnosis not present

## 2016-09-10 DIAGNOSIS — H905 Unspecified sensorineural hearing loss: Secondary | ICD-10-CM | POA: Diagnosis not present

## 2016-09-23 ENCOUNTER — Other Ambulatory Visit: Payer: Self-pay | Admitting: Family

## 2016-10-03 ENCOUNTER — Ambulatory Visit (INDEPENDENT_AMBULATORY_CARE_PROVIDER_SITE_OTHER): Payer: BLUE CROSS/BLUE SHIELD | Admitting: Family

## 2016-10-03 ENCOUNTER — Encounter: Payer: Self-pay | Admitting: Family

## 2016-10-03 VITALS — BP 121/77 | HR 68 | Temp 98.2°F | Resp 16 | Ht 69.0 in | Wt 155.4 lb

## 2016-10-03 DIAGNOSIS — G8929 Other chronic pain: Secondary | ICD-10-CM

## 2016-10-03 DIAGNOSIS — Z0000 Encounter for general adult medical examination without abnormal findings: Secondary | ICD-10-CM | POA: Diagnosis not present

## 2016-10-03 DIAGNOSIS — E038 Other specified hypothyroidism: Secondary | ICD-10-CM | POA: Diagnosis not present

## 2016-10-03 DIAGNOSIS — M25512 Pain in left shoulder: Secondary | ICD-10-CM

## 2016-10-03 LAB — BASIC METABOLIC PANEL
BUN: 17 mg/dL (ref 6–23)
CALCIUM: 9.7 mg/dL (ref 8.4–10.5)
CHLORIDE: 102 meq/L (ref 96–112)
CO2: 31 meq/L (ref 19–32)
Creatinine, Ser: 0.93 mg/dL (ref 0.40–1.50)
GFR: 89.89 mL/min (ref >60.00)
Glucose, Bld: 92 mg/dL (ref 70–99)
Potassium: 4.5 mEq/L (ref 3.5–5.1)
SODIUM: 141 meq/L (ref 135–145)

## 2016-10-03 LAB — URINALYSIS, ROUTINE W REFLEX MICROSCOPIC
BILIRUBIN URINE: NEGATIVE
HGB URINE DIPSTICK: NEGATIVE
KETONES UR: NEGATIVE
Nitrite: NEGATIVE
RBC / HPF: NONE SEEN (ref 0–?)
Specific Gravity, Urine: 1.02 (ref 1.000–1.030)
Total Protein, Urine: NEGATIVE
URINE GLUCOSE: NEGATIVE
Urobilinogen, UA: 0.2 (ref 0.0–1.0)
pH: 6 (ref 5.0–8.0)

## 2016-10-03 LAB — CBC WITH DIFFERENTIAL/PLATELET
BASOS ABS: 0.1 10*3/uL (ref 0.0–0.1)
Basophils Relative: 0.7 % (ref 0.0–3.0)
EOS ABS: 0.6 10*3/uL (ref 0.0–0.7)
Eosinophils Relative: 8 % — ABNORMAL HIGH (ref 0.0–5.0)
HCT: 44.4 % (ref 39.0–52.0)
Hemoglobin: 15 g/dL (ref 13.0–17.0)
Lymphocytes Relative: 25.8 % (ref 12.0–46.0)
Lymphs Abs: 1.9 10*3/uL (ref 0.7–4.0)
MCHC: 33.8 g/dL (ref 30.0–36.0)
MCV: 85.9 fl (ref 78.0–100.0)
MONOS PCT: 8 % (ref 3.0–12.0)
Monocytes Absolute: 0.6 10*3/uL (ref 0.1–1.0)
NEUTROS ABS: 4.1 10*3/uL (ref 1.4–7.7)
NEUTROS PCT: 57.5 % (ref 43.0–77.0)
Platelets: 243 10*3/uL (ref 150.0–400.0)
RBC: 5.17 Mil/uL (ref 4.22–5.81)
RDW: 12.9 % (ref 11.5–15.5)
WBC: 7.2 10*3/uL (ref 4.0–10.5)

## 2016-10-03 LAB — LIPID PANEL
CHOL/HDL RATIO: 5
CHOLESTEROL: 232 mg/dL — AB (ref 0–200)
HDL: 44.3 mg/dL (ref >39.00)
NonHDL: 187.23
TRIGLYCERIDES: 224 mg/dL — AB (ref 0.0–149.0)
VLDL: 44.8 mg/dL — AB (ref 0.0–40.0)

## 2016-10-03 LAB — HEPATIC FUNCTION PANEL
ALT: 20 U/L (ref 0–53)
AST: 17 U/L (ref 0–37)
Albumin: 4.6 g/dL (ref 3.5–5.2)
Alkaline Phosphatase: 55 U/L (ref 39–117)
BILIRUBIN TOTAL: 0.7 mg/dL (ref 0.2–1.2)
Bilirubin, Direct: 0.1 mg/dL (ref 0.0–0.3)
Total Protein: 7.2 g/dL (ref 6.0–8.3)

## 2016-10-03 LAB — TSH: TSH: 5.73 u[IU]/mL — ABNORMAL HIGH (ref 0.35–4.50)

## 2016-10-03 LAB — PSA: PSA: 3.9 ng/mL (ref 0.10–4.00)

## 2016-10-03 LAB — LDL CHOLESTEROL, DIRECT: LDL DIRECT: 135 mg/dL

## 2016-10-03 NOTE — Patient Instructions (Signed)
Continue healthy diet, make sure to get regular exercise such as walking 30 minutes 5 days a week.

## 2016-10-03 NOTE — Progress Notes (Signed)
Subjective:    Patient ID: Thomas Valencia, male    DOB: 1961-12-04, 55 y.o.   MRN: [id number]  HPI  Patient presents today for complete physical.  Immunizations: tetanus up to date, had flu shot at [facility].  Diet: healthy diet Exercise: fair amount Colonoscopy: due 2019 Vision: up to date Dental: up to date  Wt Readings from Last 3 Encounters:  10/03/16 155 lb 6.4 oz (70.5 kg)  08/05/15 153 lb 2 oz (69.5 kg)  02/10/15 153 lb 12.8 oz (69.8 kg)    L shoulder pain- initially had pain about 8 yrs ago. Did PT had steroid injection, MRI (showed arthritis per patient)pain resolved. Has recently flared back up. Notes some improvement with NSAIDS.  Review of Systems  Constitutional: Negative for unexpected weight change.  HENT: Positive for rhinorrhea.        Some hearing loss from vestibular schwannoma right ear  Eyes: Negative for visual disturbance.  Respiratory: Negative for cough.   Cardiovascular: Negative for leg swelling.  Gastrointestinal: Negative for blood in stool, constipation and diarrhea.  Genitourinary: Negative for dysuria and hematuria.  Musculoskeletal: Negative for myalgias.       Left shoulder pain x 1 month.  Hurts to roll over onto it.   Skin: Negative for rash.  Neurological: Negative for headaches.  Hematological: Negative for adenopathy.  Psychiatric/Behavioral:       Denies anxiety or depression   Past Medical History:  Diagnosis Date  . Hearing loss   . Hypercholesteremia   . Rhinitis   . Tinnitus, right   . Vestibular schwannoma ([place]) 12/24/12     Social History   Social History  . Marital status: Married    Spouse name: N/A  . Number of children: N/A  . Years of education: N/A   Occupational History  . Not on file.   Social History Main Topics  . Smoking status: Never Smoker  . Smokeless tobacco: Never Used  . Alcohol use 4.8 oz/week    8 Standard drinks or equivalent per week  . Drug use: No  . Sexual activity: Not on file    Other Topics Concern  . 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 NO PAST SURGERIES     denies surgical history  . WISDOM TOOTH EXTRACTION      Family History  Problem Relation Age of Onset  . Hyperlipidemia Mother   . Hypertension Mother   . Hyperlipidemia Father   . Hyperlipidemia Brother   . Other Neg Hx     colon ca, prostate ca  . Colon cancer Neg Hx     Allergies  Allergen Reactions  . Dust Mite Extract     rhinitis  . Molds & Smuts     rhinitis    Current Outpatient Prescriptions on File Prior to Visit  Medication Sig Dispense Refill  . Azelastine-Fluticasone (DYMISTA) 137-50 MCG/ACT SUSP Place 1 spray into the nose 2 (two) times daily. 23 g 2  . Omega-3 Fatty Acids (FISH OIL) 1000 MG CAPS Take 2,000 mg by mouth 2 (two) times daily.    . simvastatin (ZOCOR) 40 MG tablet TAKE 1 TABLET(40 MG) BY MOUTH DAILY 90 tablet 0   No current facility-administered medications on  file prior to visit.     BP 121/77 (BP Location: Right Arm, Cuff Size: Normal)   Pulse 68   Temp 98.2 F (36.8 C) (Oral)   Resp 16   Ht 5\' 9"  (1.753 m)   Wt 155 lb 6.4 oz (70.5 kg)   SpO2 99% Comment: room air  BMI 22.95 kg/m                 Objective:   Physical Exam   Physical Exam  Constitutional: He is oriented to person, place, and time. He appears well-developed and well-nourished. No distress.  HENT:  Head: Normocephalic and atraumatic.  Right Ear: Tympanic membrane and ear canal normal.  Left Ear: Tympanic membrane and ear canal normal.  Mouth/Throat: Oropharynx is clear and moist.  Eyes: Pupils are equal, round, and reactive to light. No scleral icterus.  Neck: Normal range of motion. No thyromegaly present.  Cardiovascular: Normal rate and regular  rhythm.   No murmur heard. Pulmonary/Chest: Effort normal and breath sounds normal. No respiratory distress. He has no wheezes. He has no rales. He exhibits no tenderness.  Abdominal: Soft. Bowel sounds are normal. He exhibits no distension and no mass. There is no tenderness. There is no rebound and no guarding.  Musculoskeletal: He exhibits no edema.  Lymphadenopathy:    He has no cervical adenopathy.  Neurological: He is alert and oriented to person, place, and time. He has normal patellar reflexes. He exhibits normal muscle tone. Coordination normal.  Skin: Skin is warm and dry.  Psychiatric: He has a normal mood and affect. His behavior is normal. Judgment and thought content normal.          Assessment & Plan:   Preventative Care- discussed healthy diet, exercise, routine lab work.  EKG tracing is personally reviewed. EKG notes NSR.  No acute changes.    Left shoulder pain- chronic, will refer to sports medicine for further evaluation.  Assessment & Plan:  L shoulder pain- will refer to sports medicine.

## 2016-10-03 NOTE — Progress Notes (Signed)
Pre visit review using our clinic review tool, if applicable. No additional management support is needed unless otherwise documented below in the visit note. 

## 2016-10-04 NOTE — Addendum Note (Signed)
Addended by: Marjory Lies on: 10/04/2016 12:01 PM   Modules accepted: Orders

## 2016-10-05 ENCOUNTER — Other Ambulatory Visit (INDEPENDENT_AMBULATORY_CARE_PROVIDER_SITE_OTHER): Payer: BLUE CROSS/BLUE SHIELD

## 2016-10-05 DIAGNOSIS — E039 Hypothyroidism, unspecified: Secondary | ICD-10-CM

## 2016-10-05 DIAGNOSIS — I43 Cardiomyopathy in diseases classified elsewhere: Secondary | ICD-10-CM

## 2016-10-05 DIAGNOSIS — I519 Heart disease, unspecified: Principal | ICD-10-CM

## 2016-10-05 LAB — T4, FREE: Free T4: 0.75 ng/dL (ref 0.60–1.60)

## 2016-10-05 LAB — T3, FREE: T3 FREE: 3.3 pg/mL (ref 2.3–4.2)

## 2016-10-05 NOTE — Addendum Note (Signed)
Addended by: Marjory Lies on: 10/05/2016 09:53 AM   Modules accepted: Orders

## 2016-10-07 ENCOUNTER — Telehealth: Payer: Self-pay | Admitting: Family

## 2016-10-07 ENCOUNTER — Encounter: Payer: Self-pay | Admitting: Family

## 2016-10-07 DIAGNOSIS — E039 Hypothyroidism, unspecified: Secondary | ICD-10-CM

## 2016-10-07 HISTORY — DX: Hypothyroidism, unspecified: E03.9

## 2016-10-07 MED ORDER — LEVOTHYROXINE SODIUM 25 MCG PO TABS: 25.0000 ug | ORAL_TABLET | Freq: Every day | ORAL | 1 refills | Status: DC

## 2016-10-07 NOTE — Telephone Encounter (Signed)
Cholesterol is elevated.  Please work on low fat/low cholesterol diet, exercise and weight loss.  Other labs look good.

## 2016-10-07 NOTE — Telephone Encounter (Signed)
Labs show mild hypothyroid, I would recommend that he start low dose synthroid, repeat tsh in 6 weeks.

## 2016-10-08 NOTE — Telephone Encounter (Signed)
Left message for pt to call the office back PC 

## 2016-10-08 NOTE — Telephone Encounter (Signed)
Notified pt. Lab appt scheduled for 11/19/16 at 9:30. Future order entered.

## 2016-10-09 ENCOUNTER — Encounter: Payer: Self-pay | Admitting: Family

## 2016-10-09 DIAGNOSIS — Z1159 Encounter for screening for other viral diseases: Secondary | ICD-10-CM

## 2016-10-09 NOTE — Telephone Encounter (Signed)
Debbrah Alar, NP  Sent: Tue October 09, 2016 12:52 PM  To: Ronny Flurry, [place]            Message   Yes, he can do hep c screening. Please advise him that there is not a shot for Hep C. tks.

## 2016-10-09 NOTE — Telephone Encounter (Signed)
Melissa-- I think pt means Hep C screen. Please advise?

## 2016-10-11 ENCOUNTER — Encounter: Payer: Self-pay | Admitting: Family Medicine

## 2016-10-11 ENCOUNTER — Ambulatory Visit (INDEPENDENT_AMBULATORY_CARE_PROVIDER_SITE_OTHER): Payer: BLUE CROSS/BLUE SHIELD | Admitting: Family Medicine

## 2016-10-11 DIAGNOSIS — M25512 Pain in left shoulder: Secondary | ICD-10-CM | POA: Diagnosis not present

## 2016-10-11 NOTE — Patient Instructions (Signed)
You have rotator cuff impingement and possible developing frozen shoulder (though only your external rotation is limited - other motions are fine). Try to avoid painful activities (overhead activities, lifting with extended arm) as much as possible. Aleve 2 tabs twice a day with food OR ibuprofen 3 tabs three times a day with food for pain and inflammation - take for 7-10 days then as needed. Can take tylenol in addition to this. Subacromial injection may be beneficial to help with pain and to decrease inflammation. Consider physical therapy with transition to home exercise program. Do home exercise program with theraband and scapular stabilization exercises daily - these are very important for long term relief even if an injection was given.  3 sets of 10 once a day. If not improving at follow-up we will consider further imaging, injection, physical therapy, and/or nitro patches. Follow up with me in 6 weeks.

## 2016-10-15 NOTE — Assessment & Plan Note (Signed)
2/2 rotator cuff impingement.  Does have limited external rotation which can be early indication of frozen shoulder.  Shown home exercises and stretches to do daily.  NSAIDs for 7-10 days then as needed.  F/u in 6 weeks.  Consider imaging, injection, PT, nitro patches.

## 2016-10-15 NOTE — Progress Notes (Signed)
PCP and consultation requested by: Nance Pear., NP  Subjective:   HPI: Patient is a 55 y.o. male here for left shoulder pain.  Patient reports for several months he's had lateral left shoulder pain. Pain is 0/10 at rest, up to 8/10 at night and when putting a shirt on, sharp. Taking advil or aleve as needed Is right handed. No prior issues with this shoulder (had injection and PT for right shoulder back in 2009). No skin changes, numbness.  Past Medical History:  Diagnosis Date  . Hearing loss   . Hypercholesteremia   . Hypothyroidism 10/07/2016  . Rhinitis   . Tinnitus, right   . Vestibular schwannoma ([place]) 12/24/12    Current Outpatient Prescriptions on File Prior to Visit  Medication Sig Dispense Refill  . Azelastine-Fluticasone (DYMISTA) 137-50 MCG/ACT SUSP Place 1 spray into the nose 2 (two) times daily. 23 g 2  . cetirizine (ZYRTEC) 10 MG tablet Take 10 mg by mouth daily as needed for allergies.    [name] levothyroxine (SYNTHROID, LEVOTHROID) 25 MCG tablet Take 1 tablet (25 mcg total) by mouth daily before breakfast. 30 tablet 1  . Omega-3 Fatty Acids (FISH OIL) 1000 MG CAPS Take 2,000 mg by mouth 2 (two) times daily.    . simvastatin (ZOCOR) 40 MG tablet TAKE 1 TABLET(40 MG) BY MOUTH DAILY 90 tablet 0   No current facility-administered medications on file prior to visit.     Past Surgical History:  Procedure Laterality Date  . NO PAST SURGERIES     denies surgical history  . WISDOM TOOTH EXTRACTION      Allergies  Allergen Reactions  . Dust Mite Extract     rhinitis  . Molds & Smuts     rhinitis    Social History   Social History  . Marital status: Married    Spouse name: N/A  . Number of children: N/A  . Years of education: N/A   Occupational History  . Not on file.   Social History Main Topics  . Smoking status: Never Smoker  . Smokeless tobacco: Never Used  . Alcohol use 4.8 oz/week    8 Standard drinks or equivalent per week  . Drug use:  No  . Sexual activity: Not on file   Other Topics Concern  . 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 Hyperlipidemia Mother   . Hypertension Mother   . Hyperlipidemia Father   . Hyperlipidemia Brother   . Other Neg Hx     colon ca, prostate ca  . Colon cancer Neg Hx     BP 125/80   Pulse 79   Ht 5\' 9"  (1.753 m)   Wt 155 lb (70.3 kg)   BMI 22.89 kg/m   Review of Systems: See HPI above.    Objective:  Physical Exam:  Gen: NAD, comfortable in exam room  Left shoulder: No swelling, ecchymoses.  No gross deformity. No TTP. Only 40 degrees ER compared to full on right.  Full abduction, flexion though. Negative Hawkins, Neers. Negative Yergasons. Strength 5/5 with empty can and resisted internal/external rotation.  Mild pain on external rotation, less with empty can. Negative apprehension. NV intact distally.  Right shoulder: FROM without pain.  Assessment & Plan:  1. Left shoulder pain - 2/2 rotator cuff impingement.  Does have limited external rotation which can be early indication of frozen shoulder.  Shown home exercises and stretches to do daily.  NSAIDs for 7-10 days then as needed.  F/u in 6 weeks.  Consider imaging, injection, PT, nitro patches.

## 2016-11-02 ENCOUNTER — Encounter: Payer: Self-pay | Admitting: Family

## 2016-11-13 ENCOUNTER — Encounter: Payer: Self-pay | Admitting: Family Medicine

## 2016-11-14 ENCOUNTER — Encounter: Payer: Self-pay | Admitting: Family Medicine

## 2016-11-14 ENCOUNTER — Ambulatory Visit (INDEPENDENT_AMBULATORY_CARE_PROVIDER_SITE_OTHER): Payer: BLUE CROSS/BLUE SHIELD | Admitting: Family Medicine

## 2016-11-14 VITALS — BP 124/86 | HR 81 | Ht 69.0 in | Wt 155.0 lb

## 2016-11-14 DIAGNOSIS — M25512 Pain in left shoulder: Secondary | ICD-10-CM

## 2016-11-14 MED ORDER — METHYLPREDNISOLONE ACETATE 40 MG/ML IJ SUSP
40.0000 mg | Freq: Once | INTRAMUSCULAR | Status: AC
  Administered 2016-11-14: 40 mg via INTRA_ARTICULAR

## 2016-11-14 NOTE — Patient Instructions (Signed)
You have a frozen shoulder (adhesive capsulitis), a buildup of scar tissue that limits motion of the shoulder joint.  The impingement part has improved. Limit lifting and overhead activities as much as possible. Heat 15 minutes at a time 3-4 times a day may help with movement and stiffness. Tylenol and/or aleve as needed for pain and inflammation. Steroid injections in a series have been shown to help with pain and motion - you were given this today. Codman exercises (pendulum, wall walking or table slides, arm circles) - do 3 sets of 10 once or twice a day. Follow up in 1 month to 6 weeks.

## 2016-11-18 NOTE — Progress Notes (Signed)
PCP and consultation requested by: Nance Pear., NP  Subjective:   HPI: Patient is a 55 y.o. male here for left shoulder pain.  10/19: Patient reports for several months he's had lateral left shoulder pain. Pain is 0/10 at rest, up to 8/10 at night and when putting a shirt on, sharp. Taking advil or aleve as needed Is right handed. No prior issues with this shoulder (had injection and PT for right shoulder back in 2009). No skin changes, numbness.  11/22: Patient reports pain in left shoulder is waking him up at night still. Pain is 1/10 at rest but still can get up to 8/10 level, sharp. Pain radiates down arm. Getting some numbness in forearm. No neck pain. Taking aleve and tylenol. Tried home exercises but worsens the pain. No skin changes.  Past Medical History:  Diagnosis Date  . Hearing loss   . Hypercholesteremia   . Hypothyroidism 10/07/2016  . Rhinitis   . Tinnitus, right   . Vestibular schwannoma ([place]) 12/24/12    Current Outpatient Prescriptions on File Prior to Visit  Medication Sig Dispense Refill  . Azelastine-Fluticasone (DYMISTA) 137-50 MCG/ACT SUSP Place 1 spray into the nose 2 (two) times daily. 23 g 2  . cetirizine (ZYRTEC) 10 MG tablet Take 10 mg by mouth daily as needed for allergies.    [name] levothyroxine (SYNTHROID, LEVOTHROID) 25 MCG tablet Take 1 tablet (25 mcg total) by mouth daily before breakfast. 30 tablet 1  . Omega-3 Fatty Acids (FISH OIL) 1000 MG CAPS Take 2,000 mg by mouth 2 (two) times daily.    . simvastatin (ZOCOR) 40 MG tablet TAKE 1 TABLET(40 MG) BY MOUTH DAILY 90 tablet 0   No current facility-administered medications on file prior to visit.     Past Surgical History:  Procedure Laterality Date  . NO PAST SURGERIES     denies surgical history  . WISDOM TOOTH EXTRACTION      Allergies  Allergen Reactions  . Dust Mite Extract     rhinitis  . Molds & Smuts     rhinitis    Social History   Social History  . Marital  status: Married    Spouse name: N/A  . Number of children: N/A  . Years of education: N/A   Occupational History  . Not on file.   Social History Main Topics  . Smoking status: Never Smoker  . Smokeless tobacco: Never Used  . Alcohol use 4.8 oz/week    8 Standard drinks or equivalent per week  . Drug use: No  . Sexual activity: Not on file   Other Topics Concern  . 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 Hyperlipidemia Mother   . Hypertension Mother   . Hyperlipidemia Father   . Hyperlipidemia Brother   . Other Neg Hx     colon ca, prostate ca  . Colon cancer Neg Hx     BP 124/86   Pulse 81   Ht 5\' 9"  (1.753 m)   Wt 155 lb (70.3 kg)   BMI 22.89 kg/m   Review of Systems: See HPI above.    Objective:  Physical Exam:  Gen: NAD, comfortable in exam room  Neck: No gross deformity, swelling, bruising.  No paraspinal TTP.  No midline/bony TTP. FROM neck without pain . BUE strength 5/5. Sensation intact to light touch.   2+ equal reflexes in triceps, biceps, brachioradialis tendons. NV intact distal BUEs.  Left shoulder: No swelling, ecchymoses.  No gross deformity. No TTP. 20 degrees ER compared to full on right.  Abduction 100 degrees, worsened since last visit. Pain with Wynonia Musty. Negative Yergasons. Strength 5/5 with empty can and resisted internal/external rotation. No pain with these resisted motions. NV intact distally.  Right shoulder: FROM without pain.    Assessment & Plan:  1. Left shoulder pain - impingement testing improved but motion worsened consistent with adhesive capsulitis (early evidence at last visit).  Heat, tylenol and/or aleve.  Shown codman exercises to do daily - hold strengthening now.  Intraarticular injection  given today.  F/u in 1 month to 6 weeks.  After informed written consent, patient was seated on exam table. Left shoulder was prepped with alcohol swab and utilizing posterior approach, patient's left glenohumeral space was injected with 3:1 marcaine: depomedrol. Patient tolerated the procedure well without immediate complications.

## 2016-11-18 NOTE — Assessment & Plan Note (Signed)
impingement testing improved but motion worsened consistent with adhesive capsulitis (early evidence at last visit).  Heat, tylenol and/or aleve.  Shown codman exercises to do daily - hold strengthening now.  Intraarticular injection given today.  F/u in 1 month to 6 weeks.  After informed written consent, patient was seated on exam table. Left shoulder was prepped with alcohol swab and utilizing posterior approach, patient's left glenohumeral space was injected with 3:1 marcaine: depomedrol. Patient tolerated the procedure well without immediate complications.

## 2016-11-19 ENCOUNTER — Other Ambulatory Visit (INDEPENDENT_AMBULATORY_CARE_PROVIDER_SITE_OTHER): Payer: BLUE CROSS/BLUE SHIELD

## 2016-11-19 DIAGNOSIS — E039 Hypothyroidism, unspecified: Secondary | ICD-10-CM | POA: Diagnosis not present

## 2016-11-19 DIAGNOSIS — Z1159 Encounter for screening for other viral diseases: Secondary | ICD-10-CM

## 2016-11-19 LAB — TSH: TSH: 2.16 u[IU]/mL (ref 0.35–4.50)

## 2016-11-20 LAB — HEPATITIS C ANTIBODY: HCV AB: NEGATIVE

## 2016-11-22 ENCOUNTER — Encounter: Payer: Self-pay | Admitting: Family

## 2016-11-23 ENCOUNTER — Encounter: Payer: Self-pay | Admitting: Family Medicine

## 2016-12-05 ENCOUNTER — Other Ambulatory Visit: Payer: Self-pay | Admitting: Family

## 2016-12-20 ENCOUNTER — Other Ambulatory Visit: Payer: Self-pay | Admitting: Family

## 2017-02-05 DIAGNOSIS — H5203 Hypermetropia, bilateral: Secondary | ICD-10-CM | POA: Diagnosis not present

## 2017-02-08 IMAGING — DX DG RIBS W/ CHEST 3+V*L*
3 series · 3 of 3 positions shown · non-contrast
Comparison: None.

CLINICAL DATA: Status post fall on lies last week with persistent
left-sided rib pain.

EXAM:
LEFT RIBS AND CHEST - 3+ VIEW

[chest pa]
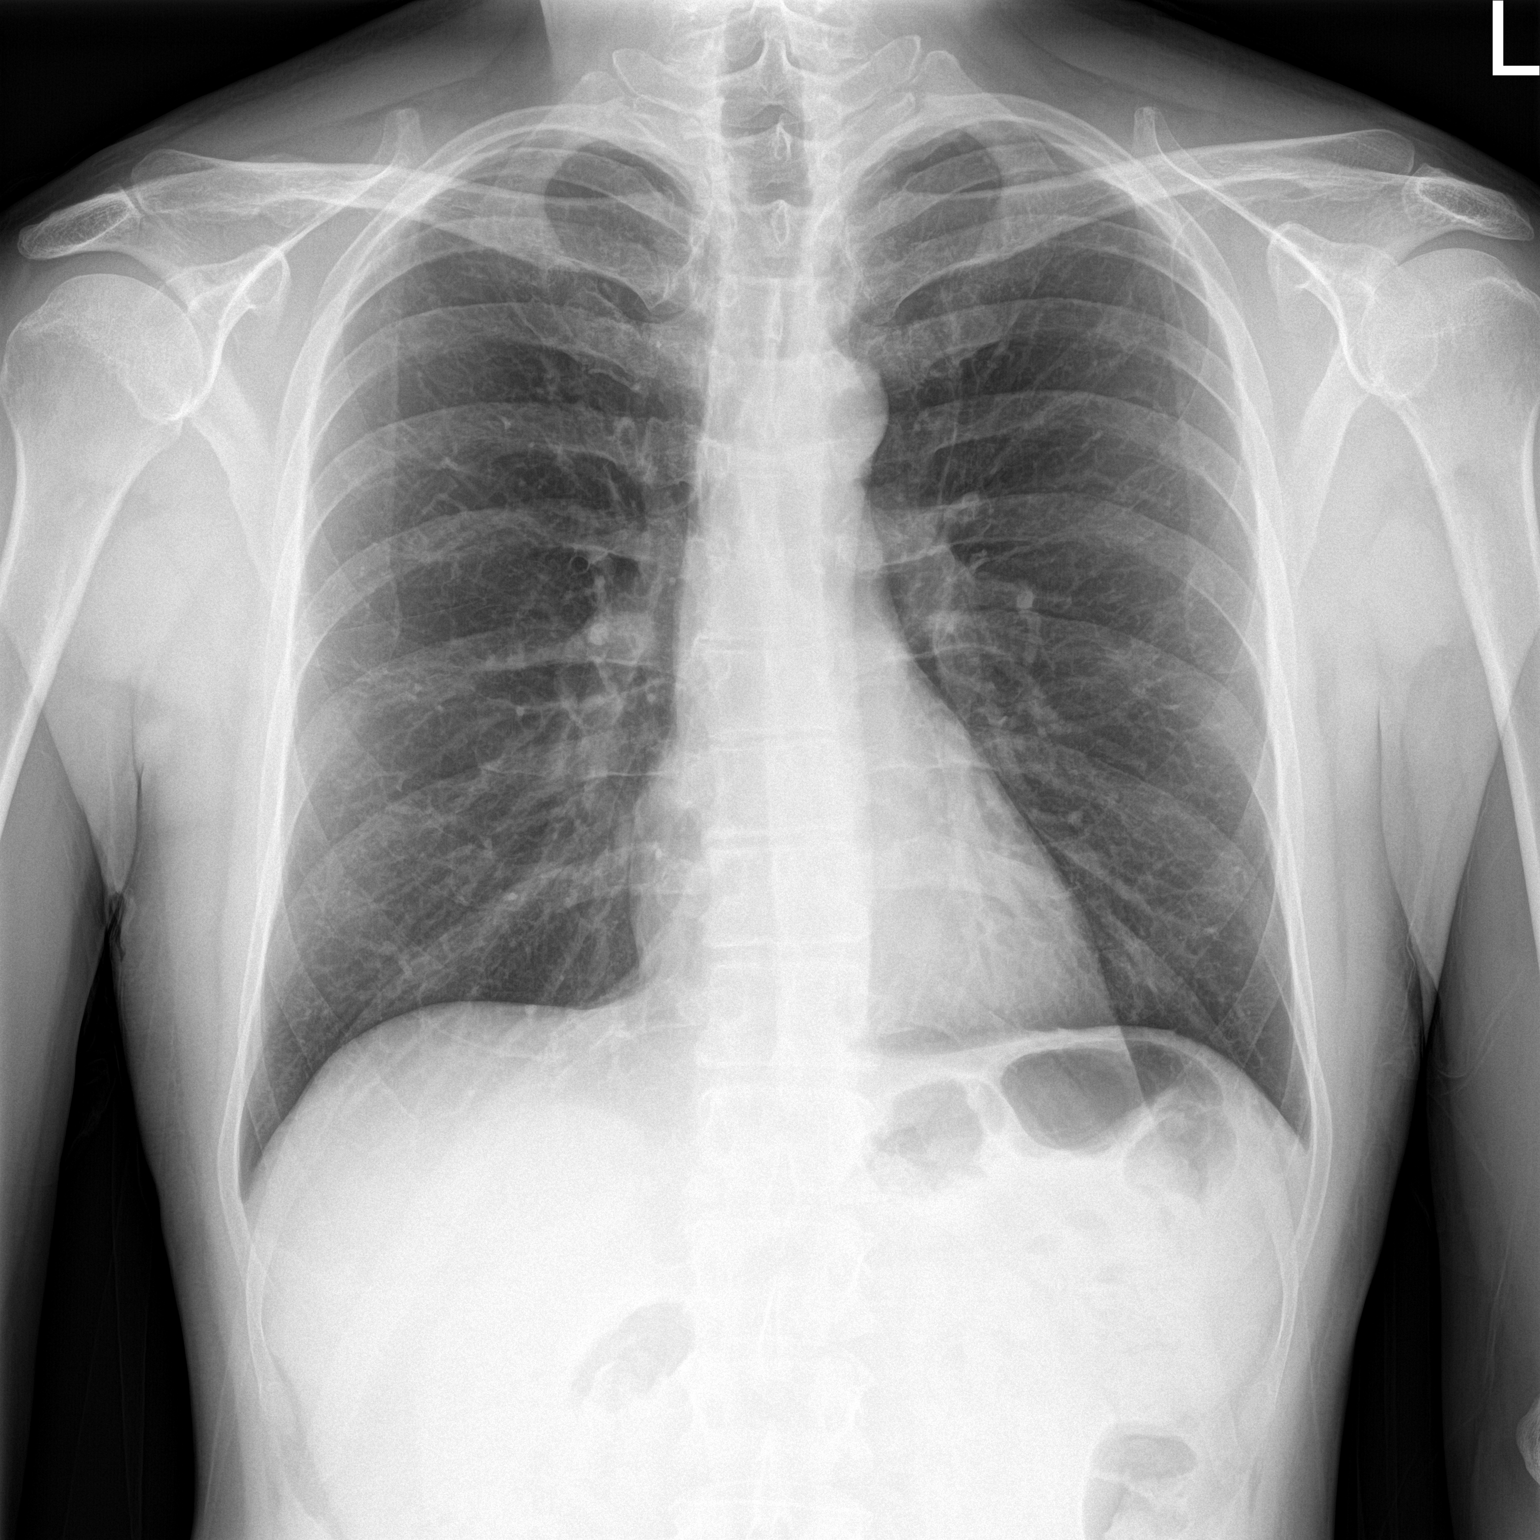

[rib ap]
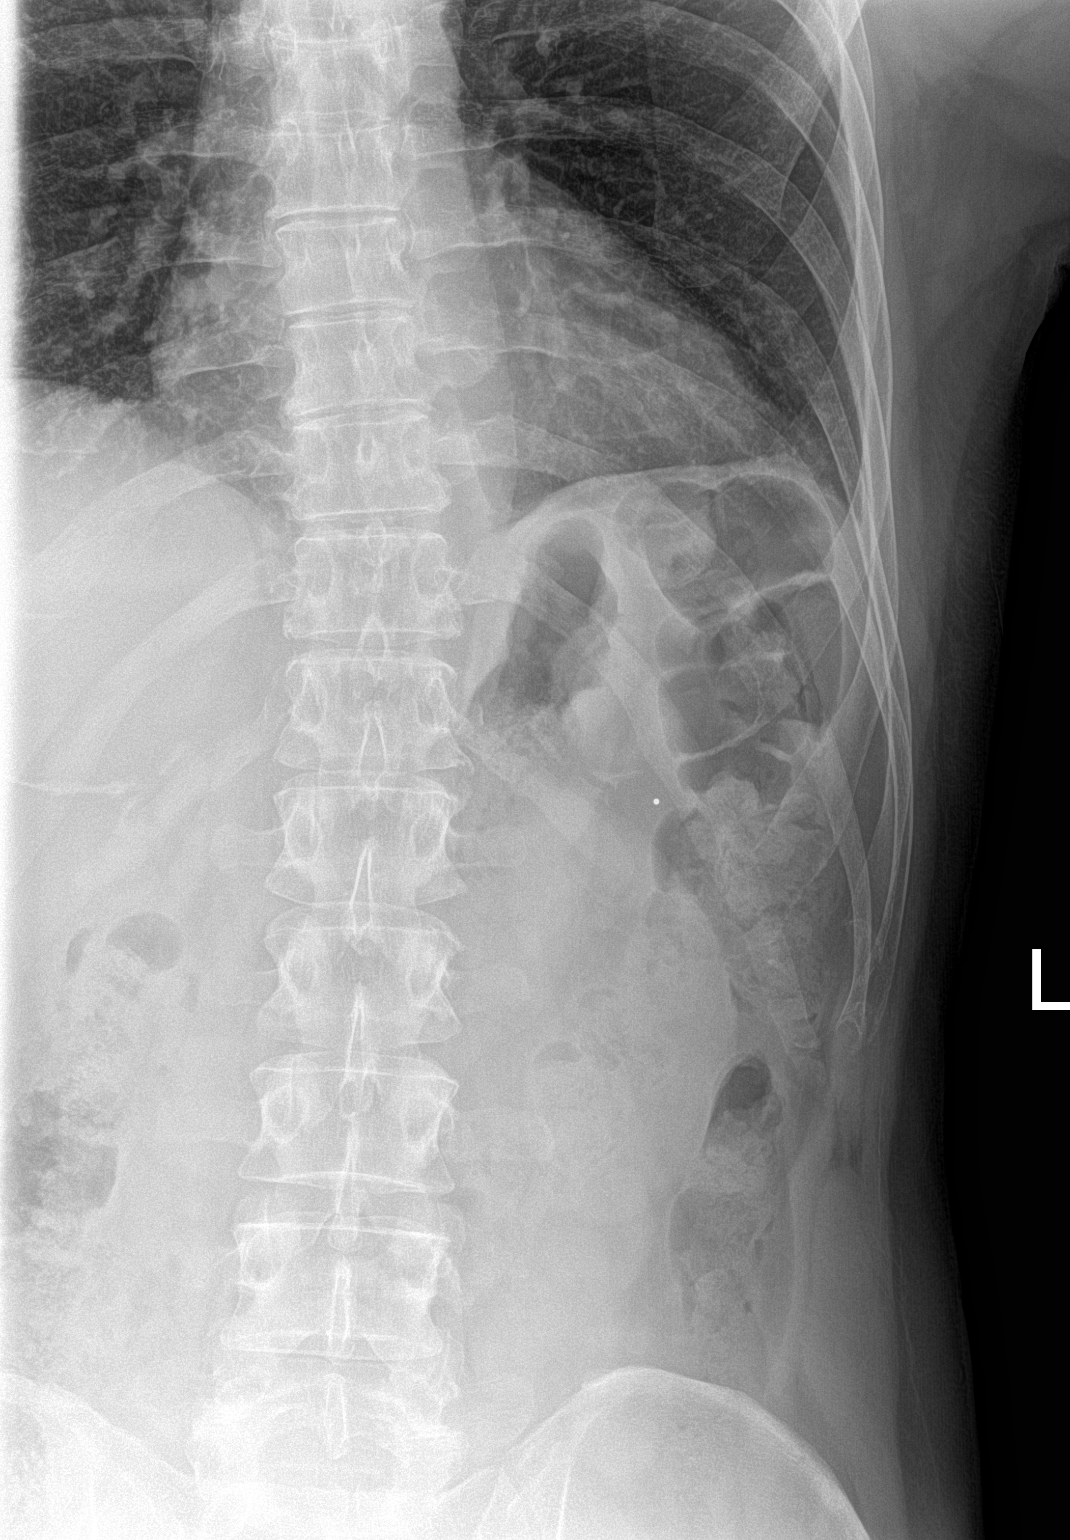

[rib ap obl]
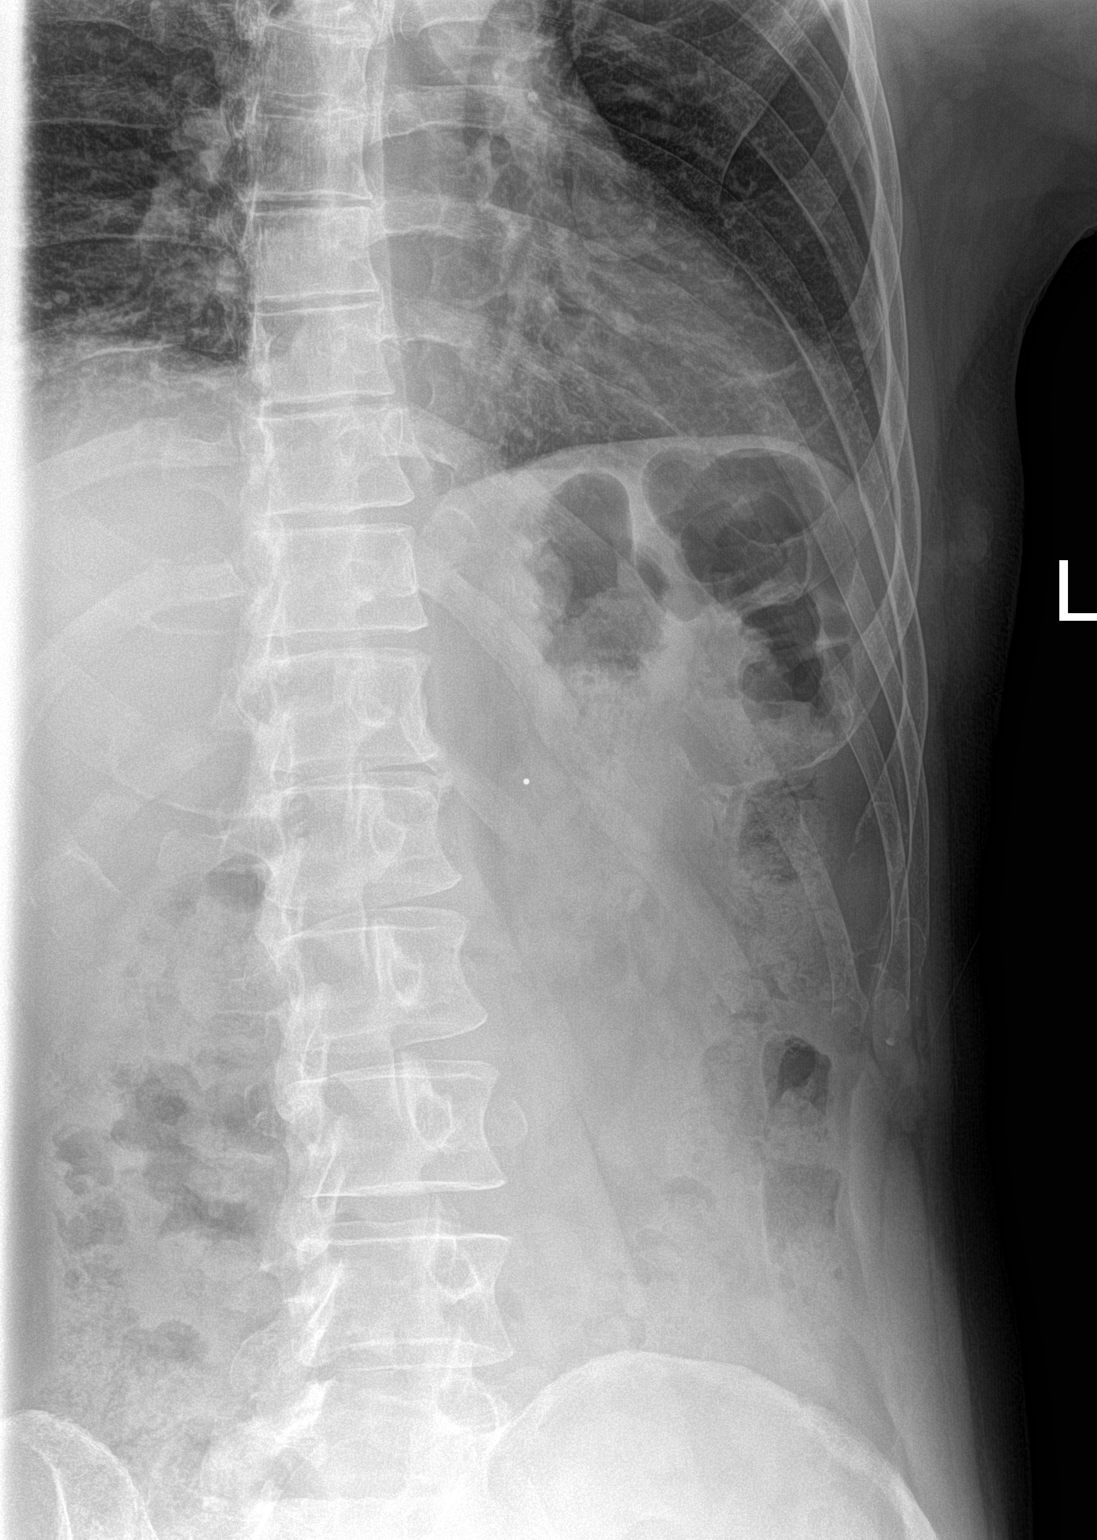

[3 of 3 positions shown; findings below may reference images not displayed]

FINDINGS: The lungs are well-expanded and clear. There is no evidence of a
pulmonary contusion, pleural effusion, or pneumothorax. The heart
and mediastinal structures are normal. The thoracic vertebral bodies
are preserved in height where visualized.

Left rib detail films reveal no acute or healing rib fracture.
Specific attention to the area marked reveals no bony abnormalities.
IMPRESSION: There is no acute cardiopulmonary abnormality. No acute abnormality
of the left ribs is demonstrated.

## 2017-03-31 ENCOUNTER — Other Ambulatory Visit: Payer: Self-pay | Admitting: Family

## 2017-04-02 NOTE — Telephone Encounter (Signed)
Refill sent per [facility] refill protocol/SLS  

## 2017-05-13 DIAGNOSIS — L814 Other melanin hyperpigmentation: Secondary | ICD-10-CM | POA: Diagnosis not present

## 2017-05-13 DIAGNOSIS — D485 Neoplasm of uncertain behavior of skin: Secondary | ICD-10-CM | POA: Diagnosis not present

## 2017-05-13 DIAGNOSIS — D225 Melanocytic nevi of trunk: Secondary | ICD-10-CM | POA: Diagnosis not present

## 2017-06-06 ENCOUNTER — Other Ambulatory Visit: Payer: Self-pay | Admitting: Family

## 2017-06-13 DIAGNOSIS — D485 Neoplasm of uncertain behavior of skin: Secondary | ICD-10-CM | POA: Diagnosis not present

## 2017-06-13 DIAGNOSIS — L905 Scar conditions and fibrosis of skin: Secondary | ICD-10-CM | POA: Diagnosis not present

## 2017-07-12 ENCOUNTER — Other Ambulatory Visit: Payer: Self-pay | Admitting: Family

## 2017-09-16 DIAGNOSIS — L820 Inflamed seborrheic keratosis: Secondary | ICD-10-CM | POA: Diagnosis not present

## 2017-09-16 DIAGNOSIS — Z872 Personal history of diseases of the skin and subcutaneous tissue: Secondary | ICD-10-CM | POA: Diagnosis not present

## 2017-10-11 ENCOUNTER — Encounter: Payer: Self-pay | Admitting: Family

## 2017-10-11 ENCOUNTER — Ambulatory Visit (INDEPENDENT_AMBULATORY_CARE_PROVIDER_SITE_OTHER): Payer: BLUE CROSS/BLUE SHIELD | Admitting: Family

## 2017-10-11 VITALS — BP 112/80 | HR 70 | Temp 97.9°F | Resp 16 | Ht 68.5 in | Wt 157.0 lb

## 2017-10-11 DIAGNOSIS — E039 Hypothyroidism, unspecified: Secondary | ICD-10-CM | POA: Diagnosis not present

## 2017-10-11 DIAGNOSIS — Z0000 Encounter for general adult medical examination without abnormal findings: Secondary | ICD-10-CM

## 2017-10-11 DIAGNOSIS — D333 Benign neoplasm of cranial nerves: Secondary | ICD-10-CM

## 2017-10-11 DIAGNOSIS — E785 Hyperlipidemia, unspecified: Secondary | ICD-10-CM

## 2017-10-11 DIAGNOSIS — E7800 Pure hypercholesterolemia, unspecified: Secondary | ICD-10-CM

## 2017-10-11 LAB — LIPID PANEL
CHOL/HDL RATIO: 4
CHOLESTEROL: 187 mg/dL (ref 0–200)
HDL: 42.8 mg/dL (ref >39.00)
LDL Cholesterol: 120 mg/dL — ABNORMAL HIGH (ref 0–99)
NonHDL: 143.71
TRIGLYCERIDES: 117 mg/dL (ref 0.0–149.0)
VLDL: 23.4 mg/dL (ref 0.0–40.0)

## 2017-10-11 LAB — CBC WITH DIFFERENTIAL/PLATELET
BASOS PCT: 0.9 % (ref 0.0–3.0)
Basophils Absolute: 0.1 10*3/uL (ref 0.0–0.1)
Eosinophils Absolute: 0.6 10*3/uL (ref 0.0–0.7)
Eosinophils Relative: 9.3 % — ABNORMAL HIGH (ref 0.0–5.0)
HEMATOCRIT: 46.6 % (ref 39.0–52.0)
Hemoglobin: 15.1 g/dL (ref 13.0–17.0)
LYMPHS ABS: 1.7 10*3/uL (ref 0.7–4.0)
LYMPHS PCT: 26.8 % (ref 12.0–46.0)
MCHC: 32.3 g/dL (ref 30.0–36.0)
MCV: 89.1 fl (ref 78.0–100.0)
Monocytes Absolute: 0.5 10*3/uL (ref 0.1–1.0)
Monocytes Relative: 7.8 % (ref 3.0–12.0)
NEUTROS ABS: 3.6 10*3/uL (ref 1.4–7.7)
Neutrophils Relative %: 55.2 % (ref 43.0–77.0)
PLATELETS: 232 10*3/uL (ref 150.0–400.0)
RBC: 5.23 Mil/uL (ref 4.22–5.81)
RDW: 13.3 % (ref 11.5–15.5)
WBC: 6.5 10*3/uL (ref 4.0–10.5)

## 2017-10-11 LAB — HEPATIC FUNCTION PANEL
ALBUMIN: 4.7 g/dL (ref 3.5–5.2)
ALK PHOS: 51 U/L (ref 39–117)
ALT: 17 U/L (ref 0–53)
AST: 17 U/L (ref 0–37)
Bilirubin, Direct: 0.1 mg/dL (ref 0.0–0.3)
TOTAL PROTEIN: 7.1 g/dL (ref 6.0–8.3)
Total Bilirubin: 1 mg/dL (ref 0.2–1.2)

## 2017-10-11 LAB — BASIC METABOLIC PANEL
BUN: 22 mg/dL (ref 6–23)
CALCIUM: 9.5 mg/dL (ref 8.4–10.5)
CHLORIDE: 98 meq/L (ref 96–112)
CO2: 33 meq/L — AB (ref 19–32)
CREATININE: 0.98 mg/dL (ref 0.40–1.50)
GFR: 84.3 mL/min (ref >60.00)
Glucose, Bld: 92 mg/dL (ref 70–99)
Potassium: 4.4 mEq/L (ref 3.5–5.1)
Sodium: 138 mEq/L (ref 135–145)

## 2017-10-11 LAB — URINALYSIS, ROUTINE W REFLEX MICROSCOPIC
Bilirubin Urine: NEGATIVE
HGB URINE DIPSTICK: NEGATIVE
Ketones, ur: NEGATIVE
Leukocytes, UA: NEGATIVE
Nitrite: NEGATIVE
Specific Gravity, Urine: >=1.03 — AB (ref 1.000–1.030)
Total Protein, Urine: NEGATIVE
Urine Glucose: NEGATIVE
Urobilinogen, UA: 0.2 (ref 0.0–1.0)
pH: 5.5 (ref 5.0–8.0)

## 2017-10-11 LAB — TSH: TSH: 4.24 u[IU]/mL (ref 0.35–4.50)

## 2017-10-11 LAB — PSA: PSA: 4.08 ng/mL — AB (ref 0.10–4.00)

## 2017-10-11 MED ORDER — LEVOTHYROXINE SODIUM 25 MCG PO TABS: ORAL_TABLET | ORAL | 1 refills | Status: DC

## 2017-10-11 MED ORDER — FLUTICASONE PROPIONATE 50 MCG/ACT NA SUSP: 2.0000 | Freq: Every day | NASAL | 6 refills | Status: AC

## 2017-10-11 MED ORDER — SIMVASTATIN 40 MG PO TABS: ORAL_TABLET | ORAL | 1 refills | Status: DC

## 2017-10-11 NOTE — Assessment & Plan Note (Signed)
Advised pt to follow up with ENT regarding narrowing of his right ear canal.

## 2017-10-11 NOTE — Assessment & Plan Note (Signed)
Clinically stable on synthroid, obtain follow up tsh.

## 2017-10-11 NOTE — Assessment & Plan Note (Signed)
Tolerating statin, obtain follow-up lipid panel. 

## 2017-10-11 NOTE — Progress Notes (Signed)
Subjective:    Patient ID: Thomas Valencia, male    DOB: 17-Mar-1961, 56 y.o.   MRN: [id number]  HPI  Patient presents today for complete physical.  Immunizations: completed shingrix, tdap 2014 Diet: healthy Exercise: some, needs to exercise Colonoscopy:  Due 9/19 Dental: up to date Vision: up to date.   Wt Readings from Last 3 Encounters:  10/11/17 157 lb (71.2 kg)  11/14/16 155 lb (70.3 kg)  10/11/16 155 lb (70.3 kg)    Hypothyroid- maintained on synthroid. Feels well on this dose. Lab Results  Component Value Date   TSH 2.16 11/19/2016   Hyperlipidemia- maintained on simvastatin.  Stopped taking fish oil.  Was having heartburn. Heartburin improved.  Lab Results  Component Value Date   CHOL 232 (H) 10/03/2016   HDL 44.30 10/03/2016   LDLCALC 128 (H) 08/05/2015   LDLDIRECT 135.0 10/03/2016   TRIG 224.0 (H) 10/03/2016   CHOLHDL 5 10/03/2016   Allergic rhinitis- maintained on otc flonase.   Lab Results  Component Value Date   PSA 3.90 10/03/2016   PSA 3.26 07/12/2014   PSA 3.63 07/12/2014     Review of Systems  Constitutional: Negative for unexpected weight change.  HENT: Positive for hearing loss. Negative for rhinorrhea.   Eyes: Negative for visual disturbance.  Cardiovascular: Negative for leg swelling.  Gastrointestinal: Negative for constipation and diarrhea.  Genitourinary: Negative for dysuria.       Mild urinary frequency  Musculoskeletal: Negative for arthralgias and myalgias.  Skin: Negative for rash.  Neurological: Negative for headaches.  Hematological: Negative for adenopathy.  Psychiatric/Behavioral:       Denies depression/anxiety      see HPI  Past Medical History:  Diagnosis Date  . Hearing loss   . Hypercholesteremia   . Hypothyroidism 10/07/2016  . Rhinitis   . Tinnitus, right   . Vestibular schwannoma ([place]) 12/24/12     Social History   Social History  . Marital status: Married    Spouse name: N/A  . Number of children:  N/A  . Years of education: N/A   Occupational History  . Not on file.   Social History Main Topics  . Smoking status: Never Smoker  . Smokeless tobacco: Never Used  . Alcohol use 4.8 oz/week    8 Standard drinks or equivalent per week  . Drug use: No  . Sexual activity: Not on file   Other Topics Concern  . 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 NO PAST SURGERIES     denies surgical history  . WISDOM TOOTH EXTRACTION      Family History  Problem Relation Age of Onset  . Hyperlipidemia Mother   . Hypertension Mother   . Hyperlipidemia Father   . Hyperlipidemia Brother   . Other Neg Hx        colon ca, prostate ca  . Colon cancer Neg Hx     Allergies  Allergen Reactions  . Dust Mite Extract     rhinitis  . Molds & Smuts     rhinitis    Current Outpatient Prescriptions on File Prior to Visit  Medication Sig Dispense Refill  . Azelastine-Fluticasone (DYMISTA) 137-50 MCG/ACT SUSP Place 1 spray into the nose  2 (two) times daily. 23 g 2  . cetirizine (ZYRTEC) 10 MG tablet Take 10 mg by mouth daily as needed for allergies.    [name] levothyroxine (SYNTHROID, LEVOTHROID) 25 MCG tablet TAKE 1 TABLET(25 MCG) BY MOUTH DAILY BEFORE BREAKFAST 90 tablet 1  . Omega-3 Fatty Acids (FISH OIL) 1000 MG CAPS Take 2,000 mg by mouth 2 (two) times daily.    . simvastatin (ZOCOR) 40 MG tablet TAKE 1 TABLET(40 MG) BY MOUTH DAILY 90 tablet 1   No current facility-administered medications on file prior to visit.     BP 112/80 (BP Location: Left Arm, Cuff Size: Normal)   Pulse 70   Temp 97.9 F (36.6 C) (Oral)   Resp 16   Ht 5' 8.5" (1.74 m)   Wt 157 lb (71.2 kg)   SpO2 98%   BMI 23.52 kg/m    Objective:   Physical Exam  Physical Exam  Constitutional: He is oriented  to person, place, and time. He appears well-developed and well-nourished. No distress.  HENT:  Head: Normocephalic and atraumatic.  Right Ear: Tympanic membrane appears normal.  Right ear canal is narrowed.   Left Ear: Tympanic membrane and ear canal normal.  Mouth/Throat: Oropharynx is clear and moist.  Eyes: Pupils are equal, round, and reactive to light. No scleral icterus.  Neck: Normal range of motion. No thyromegaly present.  Cardiovascular: Normal rate and regular rhythm.   No murmur heard. Pulmonary/Chest: Effort normal and breath sounds normal. No respiratory distress. He has no wheezes. He has no rales. He exhibits no tenderness.  Abdominal: Soft. Bowel sounds are normal. He exhibits no distension and no mass. There is no tenderness. There is no rebound and no guarding.  Musculoskeletal: He exhibits no edema.  Lymphadenopathy:    He has no cervical adenopathy.  Neurological: He is alert and oriented to person, place, and time. He has normal patellar reflexes. He exhibits normal muscle tone. Coordination normal.  Skin: Skin is warm and dry.  Psychiatric: He has a normal mood and affect. His behavior is normal. Judgment and thought content normal.           Assessment & Plan:  Preventative care- discussed healthy diet and regular exercise.  EKG tracing is personally reviewed.  EKG notes NSR.  No acute changes.  Compared to previous EKG and is unchanged.  Flu shot today.  Colo will be due next year.        Assessment & Plan:

## 2017-10-11 NOTE — Patient Instructions (Signed)
Please complete lab work prior to leaving.  Try to add 30 minutes 5 days a week of walking. Follow up with your ENT to discuss narrowing ear canal and any further recommendations.

## 2017-10-13 ENCOUNTER — Other Ambulatory Visit: Payer: Self-pay | Admitting: Family

## 2017-10-13 DIAGNOSIS — R972 Elevated prostate specific antigen [PSA]: Secondary | ICD-10-CM

## 2017-10-13 NOTE — Telephone Encounter (Signed)
Cholesterol looks great.  PSA is mildly elevated.  UA shows possible UTI.  Will rx with cipro.  I would also like for him to meet with urology for further evaluation of his PSA

## 2017-10-15 MED ORDER — CIPROFLOXACIN HCL 500 MG PO TABS: 500.0000 mg | ORAL_TABLET | Freq: Two times a day (BID) | ORAL | 0 refills | Status: DC

## 2017-10-15 NOTE — Telephone Encounter (Signed)
Notified pt and he is agreeable to proceed with referral. Rx sent to [facility].

## 2017-10-22 ENCOUNTER — Encounter: Payer: Self-pay | Admitting: Family

## 2017-12-11 DIAGNOSIS — R972 Elevated prostate specific antigen [PSA]: Secondary | ICD-10-CM | POA: Diagnosis not present

## 2018-01-01 DIAGNOSIS — N419 Inflammatory disease of prostate, unspecified: Secondary | ICD-10-CM | POA: Diagnosis not present

## 2018-01-01 DIAGNOSIS — N411 Chronic prostatitis: Secondary | ICD-10-CM | POA: Diagnosis not present

## 2018-01-01 DIAGNOSIS — R972 Elevated prostate specific antigen [PSA]: Secondary | ICD-10-CM | POA: Diagnosis not present

## 2018-01-08 DIAGNOSIS — R972 Elevated prostate specific antigen [PSA]: Secondary | ICD-10-CM | POA: Diagnosis not present

## 2018-04-18 ENCOUNTER — Encounter: Payer: Self-pay | Admitting: Family

## 2018-04-18 DIAGNOSIS — Z0000 Encounter for general adult medical examination without abnormal findings: Secondary | ICD-10-CM

## 2018-04-22 ENCOUNTER — Other Ambulatory Visit (INDEPENDENT_AMBULATORY_CARE_PROVIDER_SITE_OTHER): Payer: BLUE CROSS/BLUE SHIELD

## 2018-04-22 DIAGNOSIS — Z0000 Encounter for general adult medical examination without abnormal findings: Secondary | ICD-10-CM | POA: Diagnosis not present

## 2018-04-23 LAB — MEASLES/MUMPS/RUBELLA IMMUNITY
Mumps IgG: 247 AU/mL
RUBELLA: 30.3 {index}

## 2018-04-25 NOTE — Telephone Encounter (Signed)
Debbrah Alar, NP  Sent: Fri Apr 25, 2018 3:25 PM  To: Edwardine Deschepper, Consuello Bossier, CMA      Message   One shot should be adequate. Please contact pt re: nurse visit.

## 2018-04-29 ENCOUNTER — Ambulatory Visit (INDEPENDENT_AMBULATORY_CARE_PROVIDER_SITE_OTHER): Payer: BLUE CROSS/BLUE SHIELD | Admitting: *Deleted

## 2018-04-29 DIAGNOSIS — Z23 Encounter for immunization: Secondary | ICD-10-CM

## 2018-04-29 NOTE — Progress Notes (Signed)
Pt here for MMR booster due to low titer per Debbrah Alar, NP.  MMR given SQ left arm and pt tolerated injection well.

## 2018-05-01 ENCOUNTER — Encounter: Payer: Self-pay | Admitting: Family

## 2018-05-06 DIAGNOSIS — D333 Benign neoplasm of cranial nerves: Secondary | ICD-10-CM | POA: Diagnosis not present

## 2018-06-24 ENCOUNTER — Other Ambulatory Visit: Payer: Self-pay | Admitting: Family

## 2018-07-01 DIAGNOSIS — H903 Sensorineural hearing loss, bilateral: Secondary | ICD-10-CM | POA: Diagnosis not present

## 2018-07-01 DIAGNOSIS — H9311 Tinnitus, right ear: Secondary | ICD-10-CM | POA: Diagnosis not present

## 2018-07-01 DIAGNOSIS — D333 Benign neoplasm of cranial nerves: Secondary | ICD-10-CM | POA: Diagnosis not present

## 2018-07-01 DIAGNOSIS — H918X1 Other specified hearing loss, right ear: Secondary | ICD-10-CM | POA: Diagnosis not present

## 2018-07-07 DIAGNOSIS — R972 Elevated prostate specific antigen [PSA]: Secondary | ICD-10-CM | POA: Diagnosis not present

## 2018-07-30 ENCOUNTER — Encounter: Payer: Self-pay | Admitting: Family

## 2018-07-31 ENCOUNTER — Encounter: Payer: Self-pay | Admitting: Internal Medicine

## 2018-08-04 ENCOUNTER — Encounter: Payer: Self-pay | Admitting: Family Medicine

## 2018-08-04 ENCOUNTER — Encounter: Payer: Self-pay | Admitting: Internal Medicine

## 2018-08-04 ENCOUNTER — Ambulatory Visit (INDEPENDENT_AMBULATORY_CARE_PROVIDER_SITE_OTHER): Payer: BLUE CROSS/BLUE SHIELD | Admitting: Family Medicine

## 2018-08-04 VITALS — BP 119/76 | HR 84 | Ht 69.0 in | Wt 155.0 lb

## 2018-08-04 DIAGNOSIS — M533 Sacrococcygeal disorders, not elsewhere classified: Secondary | ICD-10-CM | POA: Diagnosis not present

## 2018-08-04 MED ORDER — METHOCARBAMOL 500 MG PO TABS: 500.0000 mg | ORAL_TABLET | Freq: Three times a day (TID) | ORAL | 1 refills | Status: DC | PRN

## 2018-08-04 NOTE — Patient Instructions (Addendum)
You have sacroiliitis (inflammation of the SI joint). Take aleve 2 tabs twice a day with food OR ibuprofen 600mg  three times a day with food for pain and inflammation for 3-5 more days then as needed. Do home exercises and stretches as directed (pick 2-3 on each handout where you feel it in this area). Robaxin as needed for severe pain - try tonight to make sure it doesn't make you too sleepy - if it doesn't you can take it during the day. Heat 15 minutes at a time as needed. Follow up with me as needed.

## 2018-08-04 NOTE — Progress Notes (Signed)
PCP: Debbrah Alar, NP  Subjective:   HPI: Patient is a 57 y.o. male here for intermittent right low back/posterior hip pain for about 2 months.  Most notably patient had worse pain last week for about 4 days.  Overall has greatly improved today.  Reports 2/10 pain that is sharp.  There is some radiation into the lateral hip/thigh.  This pain is worse when transitioning from sitting to standing.  He also notes occasional feeling of muscle spasm.  He has been taking Aleve which is been slightly helpful.  He denies any bowel/bladder symptoms.  No saddle anesthesia.  Denies any numbness, tingling, or weakness in extremities.  He denies any known mechanism of injury.  No skin changes.   Past Medical History:  Diagnosis Date  . Hearing loss   . Hypercholesteremia   . Hypothyroidism 10/07/2016  . Rhinitis   . Tinnitus, right   . Vestibular schwannoma ([place]) 12/24/12    Current Outpatient Medications on File Prior to Visit  Medication Sig Dispense Refill  . cetirizine (ZYRTEC) 10 MG tablet Take 10 mg by mouth daily as needed for allergies.    . ciprofloxacin (CIPRO) 500 MG tablet Take 1 tablet (500 mg total) by mouth 2 (two) times daily. 6 tablet 0  . fluticasone (FLONASE) 50 MCG/ACT nasal spray Place 2 sprays into both nostrils daily. 16 g 6  . levothyroxine (SYNTHROID, LEVOTHROID) 25 MCG tablet TAKE 1 TABLET(25 MCG) BY MOUTH DAILY BEFORE BREAKFAST 90 tablet 1  . Omega-3 Fatty Acids (FISH OIL) 1000 MG CAPS Take 2,000 mg by mouth 2 (two) times daily.    . simvastatin (ZOCOR) 40 MG tablet TAKE 1 TABLET(40 MG) BY MOUTH DAILY 90 tablet 1   No current facility-administered medications on file prior to visit.     Past Surgical History:  Procedure Laterality Date  . NO PAST SURGERIES     denies surgical history  . WISDOM TOOTH EXTRACTION      Allergies  Allergen Reactions  . Dust Mite Extract     rhinitis  . Molds & Smuts     rhinitis    Social History   Socioeconomic History  .  Marital status: Married    Spouse name: Not on file  . Number of children: Not on file  . Years of education: Not on file  . Highest education level: Not on file  Occupational History  . Not on file  Social Needs  . Financial resource strain: Not on file  . Food insecurity:    Worry: Not on file    Inability: Not on file  . Transportation needs:    Medical: Not on file    Non-medical: Not on file  Tobacco Use  . Smoking status: Never Smoker  . Smokeless tobacco: Never Used  Substance and Sexual Activity  . Alcohol use: Yes    Alcohol/week: 8.0 standard drinks    Types: 8 Standard drinks or equivalent per week  . Drug use: No  . Sexual activity: Not on file  Lifestyle  . Physical activity:    Days per week: Not on file    Minutes per session: Not on file  . Stress: Not on file  Relationships  . Social connections:    Talks on phone: Not on file    Gets together: Not on file    Attends religious service: Not on file    Active member of club or organization: Not on file    Attends meetings of clubs or  organizations: Not on file    Relationship status: Not on file  . Intimate partner violence:    Fear of current or ex partner: Not on file    Emotionally abused: Not on file    Physically abused: Not on file    Forced sexual activity: Not on file  Other Topics Concern  . 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 Hyperlipidemia Mother   . Hypertension Mother   . Hyperlipidemia Father   . Hyperlipidemia Brother   . Other Neg Hx        colon ca, prostate ca  . Colon cancer Neg Hx     BP 119/76   Pulse 84   Ht 5\' 9"  (1.753 m)   Wt 155 lb (70.3 kg)   BMI 22.89 kg/m   Review of Systems: See HPI above.     Objective:  Physical Exam:  Gen: awake, alert,  NAD, comfortable in exam room Pulm: breathing unlabored  Lumbar spine: - Inspection: no gross deformity or asymmetry noted, swelling or ecchymosis - Palpation: Mild tenderness over the superior aspect of the right SI joint.  Otherwise no tenderness - ROM: full active ROM of the lumbar spine in flexion and extension.  Some pain with lumbar extension - Strength: 5/5 strength of lower extremity in L4-S1 nerve root distributions b/l; normal gait - Neuro: sensation intact in the L4-S1 nerve root distribution b/l, 2+ L4 and S1 reflexes - Special testing: Negative straight leg raise, Negative FABER and FADIR   Right hip: No deformity. FROM with 5/5 strength. No tenderness to palpation. NVI distally. Negative logroll, FABER, FADIR.   Assessment & Plan:  1.  Sacroiliitis-overall patient presentation and exam are consistent with SI joint dysfunction.  No concerning red flag symptoms or findings nerve root involvement.  He is already begun to improve.  Expect full resolution over the next week or two. - Patient instructed on home exercises - Continue to take Aleve for pain. - Robaxin 500 mg every 8 hours for muscle spasm - Follow-up as needed

## 2018-08-18 DIAGNOSIS — H5203 Hypermetropia, bilateral: Secondary | ICD-10-CM | POA: Diagnosis not present

## 2018-09-11 DIAGNOSIS — Z23 Encounter for immunization: Secondary | ICD-10-CM | POA: Diagnosis not present

## 2018-09-14 ENCOUNTER — Other Ambulatory Visit: Payer: Self-pay | Admitting: Family

## 2018-09-15 ENCOUNTER — Ambulatory Visit (AMBULATORY_SURGERY_CENTER): Payer: Self-pay | Admitting: *Deleted

## 2018-09-15 ENCOUNTER — Encounter: Payer: Self-pay | Admitting: Internal Medicine

## 2018-09-15 VITALS — Ht 68.5 in | Wt 158.0 lb

## 2018-09-15 DIAGNOSIS — L821 Other seborrheic keratosis: Secondary | ICD-10-CM | POA: Diagnosis not present

## 2018-09-15 DIAGNOSIS — L814 Other melanin hyperpigmentation: Secondary | ICD-10-CM | POA: Diagnosis not present

## 2018-09-15 DIAGNOSIS — L218 Other seborrheic dermatitis: Secondary | ICD-10-CM | POA: Diagnosis not present

## 2018-09-15 DIAGNOSIS — H903 Sensorineural hearing loss, bilateral: Secondary | ICD-10-CM | POA: Insufficient documentation

## 2018-09-15 DIAGNOSIS — D225 Melanocytic nevi of trunk: Secondary | ICD-10-CM | POA: Diagnosis not present

## 2018-09-15 DIAGNOSIS — Z86010 Personal history of colonic polyps: Secondary | ICD-10-CM

## 2018-09-15 MED ORDER — NA SULFATE-K SULFATE-MG SULF 17.5-3.13-1.6 GM/177ML PO SOLN: ORAL | 0 refills | Status: DC

## 2018-09-15 NOTE — Progress Notes (Signed)
Patient denies any allergies to eggs or soy. Patient denies any problems with anesthesia/sedation. Patient denies any oxygen use at home. Patient denies taking any diet/weight loss medications or blood thinners. EMMI education offered, pt declined. suprep $50 coupon given to pt.

## 2018-09-29 ENCOUNTER — Ambulatory Visit (AMBULATORY_SURGERY_CENTER): Payer: BLUE CROSS/BLUE SHIELD | Admitting: Internal Medicine

## 2018-09-29 ENCOUNTER — Encounter: Payer: Self-pay | Admitting: Internal Medicine

## 2018-09-29 VITALS — BP 92/62 | HR 73 | Temp 98.4°F | Resp 13 | Ht 69.0 in | Wt 155.0 lb

## 2018-09-29 DIAGNOSIS — D123 Benign neoplasm of transverse colon: Secondary | ICD-10-CM

## 2018-09-29 DIAGNOSIS — Z86010 Personal history of colonic polyps: Secondary | ICD-10-CM

## 2018-09-29 MED ORDER — SODIUM CHLORIDE 0.9 % IV SOLN: 500.0000 mL | Freq: Once | INTRAVENOUS | Status: DC

## 2018-09-29 NOTE — Op Note (Signed)
[facility] Patient Name: Thomas Valencia Procedure Date: 09/29/2018 11:36 AM MRN: [id number] Endoscopist: Jerene Bears , MD Age: 57 Referring MD:  Date of Birth: [birth year] Gender: Male Account #: [id number] Procedure:                Colonoscopy Indications:              Surveillance: Personal history of adenomatous                            polyps on last colonoscopy 5 years ago Medicines:                Monitored Anesthesia Care Procedure:                Pre-Anesthesia Assessment:                           - Prior to the procedure, a History and Physical                            was performed, and patient medications and                            allergies were reviewed. The patient's tolerance of                            previous anesthesia was also reviewed. The risks                            and benefits of the procedure and the sedation                            options and risks were discussed with the patient.                            All questions were answered, and informed consent                            was obtained. Prior Anticoagulants: The patient has                            taken no previous anticoagulant or antiplatelet                            agents. ASA Grade Assessment: II - A patient with                            mild systemic disease. After reviewing the risks                            and benefits, the patient was deemed in                            satisfactory condition to undergo the procedure.  After obtaining informed consent, the colonoscope                            was passed under direct vision. Throughout the                            procedure, the patient's blood pressure, pulse, and                            oxygen saturations were monitored continuously. The                            Colonoscope was introduced through the anus and                            advanced to the cecum,  identified by appendiceal                            orifice and ileocecal valve. The colonoscopy was                            performed without difficulty. The patient tolerated                            the procedure well. The quality of the bowel                            preparation was good. The ileocecal valve,                            appendiceal orifice, and rectum were photographed. Scope In: 11:57:36 AM Scope Out: 12:10:18 PM Scope Withdrawal Time: 0 hours 10 minutes 48 seconds  Total Procedure Duration: 0 hours 12 minutes 42 seconds  Findings:                 The digital rectal exam was normal.                           A 4 mm polyp was found in the transverse colon. The                            polyp was sessile. The polyp was removed with a                            cold snare. Resection and retrieval were complete.                           Anal papilla(e) were hypertrophied on retroflexed                            views in the rectum. Retroflexion in rectum                            otherwise normal.  The exam was otherwise normal throughout the                            examined colon. Complications:            No immediate complications. Estimated Blood Loss:     Estimated blood loss was minimal. Impression:               - One 4 mm polyp in the transverse colon, removed                            with a cold snare. Resected and retrieved. Recommendation:           - Patient has a contact number available for                            emergencies. The signs and symptoms of potential                            delayed complications were discussed with the                            patient. Return to normal activities tomorrow.                            Written discharge instructions were provided to the                            patient.                           - Resume previous diet.                           - Continue present  medications.                           - Await pathology results.                           - Repeat colonoscopy in 5 years for surveillance. Jerene Bears, MD 09/29/2018 12:13:40 PM This report has been signed electronically.

## 2018-09-29 NOTE — Patient Instructions (Signed)
YOU HAD AN ENDOSCOPIC PROCEDURE TODAY AT THE [facility]:   Refer to the procedure report that was given to you for any specific questions about what was found during the examination.  If the procedure report does not answer your questions, please call your gastroenterologist to clarify.  If you requested that your care partner not be given the details of your procedure findings, then the procedure report has been included in a sealed envelope for you to review at your convenience later.  YOU SHOULD EXPECT: Some feelings of bloating in the abdomen. Passage of more gas than usual.  Walking can help get rid of the air that was put into your GI tract during the procedure and reduce the bloating. If you had a lower endoscopy (such as a colonoscopy or flexible sigmoidoscopy) you may notice spotting of blood in your stool or on the toilet paper. If you underwent a bowel prep for your procedure, you may not have a normal bowel movement for a few days.  Please Note:  You might notice some irritation and congestion in your nose or some drainage.  This is from the oxygen used during your procedure.  There is no need for concern and it should clear up in a day or so.  SYMPTOMS TO REPORT IMMEDIATELY:   Following lower endoscopy (colonoscopy or flexible sigmoidoscopy):  Excessive amounts of blood in the stool  Significant tenderness or worsening of abdominal pains  Swelling of the abdomen that is new, acute  Fever of 100F or higher  Please see handouts given to you on Polyps.  For urgent or emergent issues, a gastroenterologist can be reached at any hour by calling [phone].   DIET:  We do recommend a small meal at first, but then you may proceed to your regular diet.  Drink plenty of fluids but you should avoid alcoholic beverages for 24 hours.  ACTIVITY:  You should plan to take it easy for the rest of today and you should NOT DRIVE or use heavy machinery until tomorrow (because of the  sedation medicines used during the test).    FOLLOW UP: Our staff will call the number listed on your records the next business day following your procedure to check on you and address any questions or concerns that you may have regarding the information given to you following your procedure. If we do not reach you, we will leave a message.  However, if you are feeling well and you are not experiencing any problems, there is no need to return our call.  We will assume that you have returned to your regular daily activities without incident.  If any biopsies were taken you will be contacted by phone or by letter within the next 1-3 weeks.  Please call us at [phone] if you have not heard about the biopsies in 3 weeks.    SIGNATURES/CONFIDENTIALITY: You and/or your care partner have signed paperwork which will be entered into your electronic medical record.  These signatures attest to the fact that that the information above on your After Visit Summary has been reviewed and is understood.  Full responsibility of the confidentiality of this discharge information lies with you and/or your care-partner.  Thank you for letting us take care of your healthcare needs today. 

## 2018-09-29 NOTE — Progress Notes (Signed)
Called to room to assist during endoscopic procedure.  Patient ID and intended procedure confirmed with present staff. Received instructions for my participation in the procedure from the performing physician.  

## 2018-09-29 NOTE — Progress Notes (Signed)
Pt's states no medical or surgical changes since previsit or office visit. 

## 2018-09-29 NOTE — Progress Notes (Signed)
Report given to PACU, vss 

## 2018-09-30 ENCOUNTER — Telehealth: Payer: Self-pay

## 2018-09-30 NOTE — Telephone Encounter (Signed)
Left message

## 2018-09-30 NOTE — Telephone Encounter (Signed)
No answer, left message to call back later today, B.Terie Lear RN. 

## 2018-10-02 ENCOUNTER — Encounter: Payer: Self-pay | Admitting: Internal Medicine

## 2018-10-10 ENCOUNTER — Ambulatory Visit (INDEPENDENT_AMBULATORY_CARE_PROVIDER_SITE_OTHER): Payer: BLUE CROSS/BLUE SHIELD | Admitting: Family

## 2018-10-10 ENCOUNTER — Encounter: Payer: Self-pay | Admitting: Family

## 2018-10-10 ENCOUNTER — Telehealth: Payer: Self-pay | Admitting: Family

## 2018-10-10 VITALS — BP 100/78 | HR 83 | Temp 97.7°F | Resp 16 | Ht 69.0 in | Wt 160.0 lb

## 2018-10-10 DIAGNOSIS — R972 Elevated prostate specific antigen [PSA]: Secondary | ICD-10-CM

## 2018-10-10 DIAGNOSIS — Z125 Encounter for screening for malignant neoplasm of prostate: Secondary | ICD-10-CM

## 2018-10-10 DIAGNOSIS — Z0000 Encounter for general adult medical examination without abnormal findings: Secondary | ICD-10-CM | POA: Diagnosis not present

## 2018-10-10 LAB — BASIC METABOLIC PANEL
BUN: 20 mg/dL (ref 6–23)
CALCIUM: 9.7 mg/dL (ref 8.4–10.5)
CO2: 32 mEq/L (ref 19–32)
Chloride: 101 mEq/L (ref 96–112)
Creatinine, Ser: 1.04 mg/dL (ref 0.40–1.50)
GFR: 78.43 mL/min (ref >60.00)
Glucose, Bld: 102 mg/dL — ABNORMAL HIGH (ref 70–99)
POTASSIUM: 4.7 meq/L (ref 3.5–5.1)
SODIUM: 139 meq/L (ref 135–145)

## 2018-10-10 LAB — HEPATIC FUNCTION PANEL
ALK PHOS: 58 U/L (ref 39–117)
ALT: 22 U/L (ref 0–53)
AST: 16 U/L (ref 0–37)
Albumin: 4.6 g/dL (ref 3.5–5.2)
BILIRUBIN TOTAL: 0.6 mg/dL (ref 0.2–1.2)
Bilirubin, Direct: 0.1 mg/dL (ref 0.0–0.3)
Total Protein: 7 g/dL (ref 6.0–8.3)

## 2018-10-10 LAB — TSH: TSH: 3.06 u[IU]/mL (ref 0.35–4.50)

## 2018-10-10 LAB — URINALYSIS, ROUTINE W REFLEX MICROSCOPIC
BILIRUBIN URINE: NEGATIVE
HGB URINE DIPSTICK: NEGATIVE
KETONES UR: NEGATIVE
LEUKOCYTES UA: NEGATIVE
NITRITE: NEGATIVE
Specific Gravity, Urine: 1.02 (ref 1.000–1.030)
Total Protein, Urine: NEGATIVE
Urine Glucose: NEGATIVE
Urobilinogen, UA: 0.2 (ref 0.0–1.0)
pH: 6 (ref 5.0–8.0)

## 2018-10-10 LAB — CBC WITH DIFFERENTIAL/PLATELET
BASOS ABS: 0 10*3/uL (ref 0.0–0.1)
Basophils Relative: 0.4 % (ref 0.0–3.0)
EOS PCT: 3.7 % (ref 0.0–5.0)
Eosinophils Absolute: 0.2 10*3/uL (ref 0.0–0.7)
HEMATOCRIT: 45 % (ref 39.0–52.0)
HEMOGLOBIN: 15.2 g/dL (ref 13.0–17.0)
LYMPHS PCT: 19.8 % (ref 12.0–46.0)
Lymphs Abs: 1.3 10*3/uL (ref 0.7–4.0)
MCHC: 33.8 g/dL (ref 30.0–36.0)
MCV: 87.6 fl (ref 78.0–100.0)
MONOS PCT: 8.2 % (ref 3.0–12.0)
Monocytes Absolute: 0.5 10*3/uL (ref 0.1–1.0)
Neutro Abs: 4.4 10*3/uL (ref 1.4–7.7)
Neutrophils Relative %: 67.9 % (ref 43.0–77.0)
Platelets: 231 10*3/uL (ref 150.0–400.0)
RBC: 5.14 Mil/uL (ref 4.22–5.81)
RDW: 13 % (ref 11.5–15.5)
WBC: 6.5 10*3/uL (ref 4.0–10.5)

## 2018-10-10 LAB — LIPID PANEL
CHOLESTEROL: 197 mg/dL (ref 0–200)
HDL: 45.6 mg/dL (ref >39.00)
LDL CALC: 125 mg/dL — AB (ref 0–99)
NonHDL: 151.29
Total CHOL/HDL Ratio: 4
Triglycerides: 133 mg/dL (ref 0.0–149.0)
VLDL: 26.6 mg/dL (ref 0.0–40.0)

## 2018-10-10 LAB — PSA: PSA: 5.85 ng/mL — ABNORMAL HIGH (ref 0.10–4.00)

## 2018-10-10 NOTE — Telephone Encounter (Signed)
Left message on cell that I sent him a detailed message in mychart that I would like him to read regarding his lab work.

## 2018-10-10 NOTE — Progress Notes (Signed)
Subjective:    Patient ID: Thomas Valencia, male    DOB: Nov 27, 1961, 57 y.o.   MRN: [id number]  HPI  Patient presents today for complete physical.  Immunizations: shingrix/tdap up to date, had flu shot at [facility] Diet: healthy Exercise: some exercise Wt Readings from Last 3 Encounters:  10/10/18 160 lb (72.6 kg)  09/29/18 155 lb (70.3 kg)  09/15/18 158 lb (71.7 kg)  Colonoscopy: 09/29/18- polyp (tubular adenoma) due for a 5 yr follow up 2024. PSA:  Had neg prostate biopsy 1/19 with urology.  Lab Results  Component Value Date   PSA 4.08 (H) 10/11/2017   PSA 3.90 10/03/2016   PSA 3.26 07/12/2014   PSA 3.63 07/12/2014  dental: up to date Vision: up to date     Review of Systems  Constitutional: Negative for unexpected weight change.  HENT: Positive for hearing loss. Negative for rhinorrhea.   Eyes: Negative for visual disturbance.  Respiratory: Negative for cough.   Cardiovascular: Negative for leg swelling.  Gastrointestinal: Negative for blood in stool, constipation and diarrhea.  Genitourinary: Negative for dysuria and frequency.  Musculoskeletal: Negative for arthralgias and myalgias.  Skin: Negative for rash.  Neurological: Negative for headaches.  Hematological: Negative for adenopathy.  Psychiatric/Behavioral:       Denies depression/anxiety   Past Medical History:  Diagnosis Date  . Allergy   . Hearing loss   . Hypercholesteremia   . Hypothyroidism 10/07/2016  . Rhinitis   . Tinnitus, right   . Vestibular schwannoma ([place]) 12/24/12     Social History   Socioeconomic History  . Marital status: Married    Spouse name: Not on file  . Number of children: Not on file  . Years of education: Not on file  . Highest education level: Not on file  Occupational History  . Not on file  Social Needs  . Financial resource strain: Not on file  . Food insecurity:    Worry: Not on file    Inability: Not on file  . Transportation needs:    Medical: Not on  file    Non-medical: Not on file  Tobacco Use  . Smoking status: Never Smoker  . Smokeless tobacco: Never Used  Substance and Sexual Activity  . Alcohol use: Yes    Alcohol/week: 6.0 standard drinks    Types: 6 Standard drinks or equivalent per week  . Drug use: No  . Sexual activity: Not on file  Lifestyle  . Physical activity:    Days per week: Not on file    Minutes per session: Not on file  . Stress: Not on file  Relationships  . Social connections:    Talks on phone: Not on file    Gets together: Not on file    Attends religious service: Not on file    Active member of club or organization: Not on file    Attends meetings of clubs or organizations: Not on file    Relationship status: Not on file  . Intimate partner violence:    Fear of current or ex partner: Not on file    Emotionally abused: Not on file    Physically abused: Not on file    Forced sexual activity: Not on file  Other Topics Concern  . 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 COLONOSCOPY  08/31/2013  . NO PAST SURGERIES     denies surgical history  . POLYPECTOMY    . WISDOM TOOTH EXTRACTION      Family History  Problem Relation Age of Onset  . Hyperlipidemia Mother   . Hypertension Mother   . Hyperlipidemia Father   . Hyperlipidemia Brother   . Other Neg Hx        colon ca, prostate ca  . Colon cancer Neg Hx   . Colon polyps Neg Hx   . Esophageal cancer Neg Hx   . Stomach cancer Neg Hx   . Rectal cancer Neg Hx     Allergies  Allergen Reactions  . Dust Mite Extract     rhinitis  . Molds & Smuts     rhinitis    Current Outpatient Medications on File Prior to Visit  Medication Sig Dispense Refill  . cetirizine (ZYRTEC) 10 MG tablet Take 10 mg by mouth daily as needed for  allergies.    . fluticasone (FLONASE) 50 MCG/ACT nasal spray Place 2 sprays into both nostrils daily. 16 g 6  . JUBLIA 10 % SOLN Apply 10 application topically.  2  . levothyroxine (SYNTHROID, LEVOTHROID) 25 MCG tablet TAKE 1 TABLET(25 MCG) BY MOUTH DAILY BEFORE BREAKFAST 90 tablet 1  . simvastatin (ZOCOR) 40 MG tablet TAKE 1 TABLET(40 MG) BY MOUTH DAILY 90 tablet 1   Current Facility-Administered Medications on File Prior to Visit  Medication Dose Route Frequency Provider Last Rate Last Dose  . 0.9 %  sodium chloride infusion  500 mL Intravenous Once Pyrtle, Lajuan Lines, MD        BP 100/78 (BP Location: Right Arm, Patient Position: Sitting, Cuff Size: Small)   Pulse 83   Temp 97.7 F (36.5 C) (Oral)   Resp 16   Ht 5\' 9"  (1.753 m)   Wt 160 lb (72.6 kg)   SpO2 95%   BMI 23.63 kg/m       Objective:   Physical Exam  Physical Exam  Constitutional: He is oriented to person, place, and time. He appears well-developed and well-nourished. No distress.  HENT:  Head: Normocephalic and atraumatic.  Right Ear: Tympanic membrane and ear canal normal.  Left Ear: Tympanic membrane and ear canal normal.  Mouth/Throat: Oropharynx is clear and moist.  Eyes: Pupils are equal, round, and reactive to light. No scleral icterus.  Neck: Normal range of motion. No thyromegaly present.  Cardiovascular: Normal rate and regular rhythm.   No murmur heard. Pulmonary/Chest: Effort normal and breath sounds normal. No respiratory distress. He has no wheezes. He has no rales. He exhibits no tenderness.  Abdominal: Soft. Bowel sounds are normal. He exhibits no distension and no mass. There is no tenderness. There is no rebound and no guarding.  Musculoskeletal: He exhibits no edema.  Lymphadenopathy:    He has no cervical adenopathy.  Neurological: He is alert and oriented to person, place, and time. He has normal patellar reflexes. He exhibits normal muscle tone. Coordination normal.  Skin: Skin is warm and  dry.  Psychiatric: He has a normal mood and affect. His behavior is normal. Judgment and thought content normal.           Assessment & Plan:   Preventative care- immunizations reviewed and up to date. Colo up to date, obtain routine lab work. EKG tracing is personally reviewed.  EKG notes NSR.  No  acute changes.        Assessment & Plan:

## 2018-10-10 NOTE — Patient Instructions (Signed)
Please complete lab work prior to leaving. Continue healthy diet and exercise.  

## 2018-11-11 DIAGNOSIS — N400 Enlarged prostate without lower urinary tract symptoms: Secondary | ICD-10-CM | POA: Diagnosis not present

## 2018-11-18 ENCOUNTER — Other Ambulatory Visit: Payer: Self-pay | Admitting: Family

## 2018-11-18 NOTE — Telephone Encounter (Signed)
Rx approved and sent to the pharmacy.//AB/CMA 

## 2018-11-19 DIAGNOSIS — N400 Enlarged prostate without lower urinary tract symptoms: Secondary | ICD-10-CM | POA: Diagnosis not present

## 2018-12-03 DIAGNOSIS — L309 Dermatitis, unspecified: Secondary | ICD-10-CM | POA: Diagnosis not present

## 2018-12-23 ENCOUNTER — Other Ambulatory Visit: Payer: Self-pay | Admitting: Family

## 2019-03-04 ENCOUNTER — Encounter: Payer: Self-pay | Admitting: Family

## 2019-04-29 DIAGNOSIS — N400 Enlarged prostate without lower urinary tract symptoms: Secondary | ICD-10-CM | POA: Diagnosis not present

## 2019-05-06 DIAGNOSIS — N400 Enlarged prostate without lower urinary tract symptoms: Secondary | ICD-10-CM | POA: Diagnosis not present

## 2019-05-06 DIAGNOSIS — R972 Elevated prostate specific antigen [PSA]: Secondary | ICD-10-CM | POA: Diagnosis not present

## 2019-05-07 ENCOUNTER — Other Ambulatory Visit: Payer: Self-pay | Admitting: Urology

## 2019-05-07 DIAGNOSIS — R972 Elevated prostate specific antigen [PSA]: Secondary | ICD-10-CM

## 2019-05-21 DIAGNOSIS — R972 Elevated prostate specific antigen [PSA]: Secondary | ICD-10-CM | POA: Diagnosis not present

## 2019-05-25 DIAGNOSIS — N400 Enlarged prostate without lower urinary tract symptoms: Secondary | ICD-10-CM | POA: Diagnosis not present

## 2019-05-25 DIAGNOSIS — R972 Elevated prostate specific antigen [PSA]: Secondary | ICD-10-CM | POA: Diagnosis not present

## 2019-06-18 ENCOUNTER — Other Ambulatory Visit: Payer: Self-pay

## 2019-06-18 MED ORDER — LEVOTHYROXINE SODIUM 25 MCG PO TABS: 25.0000 ug | ORAL_TABLET | Freq: Every day | ORAL | 1 refills | Status: DC

## 2019-06-18 MED ORDER — SIMVASTATIN 40 MG PO TABS: ORAL_TABLET | ORAL | 1 refills | Status: DC

## 2019-09-21 DIAGNOSIS — D225 Melanocytic nevi of trunk: Secondary | ICD-10-CM | POA: Diagnosis not present

## 2019-09-21 DIAGNOSIS — L300 Nummular dermatitis: Secondary | ICD-10-CM | POA: Diagnosis not present

## 2019-09-21 DIAGNOSIS — L821 Other seborrheic keratosis: Secondary | ICD-10-CM | POA: Diagnosis not present

## 2019-09-21 DIAGNOSIS — D1801 Hemangioma of skin and subcutaneous tissue: Secondary | ICD-10-CM | POA: Diagnosis not present

## 2019-10-15 ENCOUNTER — Other Ambulatory Visit: Payer: Self-pay

## 2019-10-16 ENCOUNTER — Encounter: Payer: Self-pay | Admitting: Family

## 2019-10-16 ENCOUNTER — Ambulatory Visit (INDEPENDENT_AMBULATORY_CARE_PROVIDER_SITE_OTHER): Payer: BLUE CROSS/BLUE SHIELD | Admitting: Family

## 2019-10-16 VITALS — BP 123/83 | HR 69 | Temp 96.2°F | Resp 16 | Ht 69.0 in | Wt 155.0 lb

## 2019-10-16 DIAGNOSIS — R972 Elevated prostate specific antigen [PSA]: Secondary | ICD-10-CM

## 2019-10-16 DIAGNOSIS — Z0000 Encounter for general adult medical examination without abnormal findings: Secondary | ICD-10-CM

## 2019-10-16 LAB — HEPATIC FUNCTION PANEL
ALT: 17 U/L (ref 0–53)
AST: 16 U/L (ref 0–37)
Albumin: 4.7 g/dL (ref 3.5–5.2)
Alkaline Phosphatase: 57 U/L (ref 39–117)
Bilirubin, Direct: 0.1 mg/dL (ref 0.0–0.3)
Total Bilirubin: 0.8 mg/dL (ref 0.2–1.2)
Total Protein: 6.8 g/dL (ref 6.0–8.3)

## 2019-10-16 LAB — BASIC METABOLIC PANEL
BUN: 23 mg/dL (ref 6–23)
CO2: 31 mEq/L (ref 19–32)
Calcium: 9.4 mg/dL (ref 8.4–10.5)
Chloride: 103 mEq/L (ref 96–112)
Creatinine, Ser: 0.95 mg/dL (ref 0.40–1.50)
GFR: 81.62 mL/min (ref >60.00)
Glucose, Bld: 98 mg/dL (ref 70–99)
Potassium: 4.7 mEq/L (ref 3.5–5.1)
Sodium: 140 mEq/L (ref 135–145)

## 2019-10-16 LAB — CBC WITH DIFFERENTIAL/PLATELET
Basophils Absolute: 0.1 10*3/uL (ref 0.0–0.1)
Basophils Relative: 1 % (ref 0.0–3.0)
Eosinophils Absolute: 0.4 10*3/uL (ref 0.0–0.7)
Eosinophils Relative: 6.8 % — ABNORMAL HIGH (ref 0.0–5.0)
HCT: 44.2 % (ref 39.0–52.0)
Hemoglobin: 14.7 g/dL (ref 13.0–17.0)
Lymphocytes Relative: 29.3 % (ref 12.0–46.0)
Lymphs Abs: 1.6 10*3/uL (ref 0.7–4.0)
MCHC: 33.3 g/dL (ref 30.0–36.0)
MCV: 88.5 fl (ref 78.0–100.0)
Monocytes Absolute: 0.5 10*3/uL (ref 0.1–1.0)
Monocytes Relative: 9.3 % (ref 3.0–12.0)
Neutro Abs: 2.9 10*3/uL (ref 1.4–7.7)
Neutrophils Relative %: 53.6 % (ref 43.0–77.0)
Platelets: 226 10*3/uL (ref 150.0–400.0)
RBC: 4.99 Mil/uL (ref 4.22–5.81)
RDW: 12.8 % (ref 11.5–15.5)
WBC: 5.3 10*3/uL (ref 4.0–10.5)

## 2019-10-16 LAB — LIPID PANEL
Cholesterol: 188 mg/dL (ref 0–200)
HDL: 44.2 mg/dL (ref >39.00)
LDL Cholesterol: 124 mg/dL — ABNORMAL HIGH (ref 0–99)
NonHDL: 144.14
Total CHOL/HDL Ratio: 4
Triglycerides: 103 mg/dL (ref 0.0–149.0)
VLDL: 20.6 mg/dL (ref 0.0–40.0)

## 2019-10-16 LAB — PSA: PSA: 3.64 ng/mL (ref 0.10–4.00)

## 2019-10-16 LAB — TSH: TSH: 4.73 u[IU]/mL — ABNORMAL HIGH (ref 0.35–4.50)

## 2019-10-16 NOTE — Progress Notes (Signed)
Subjective:    Patient ID: Thomas Valencia, male    DOB: August 29, 1961, 58 y.o.   MRN: [id number]  HPI  Patient presents today for complete physical.  Immunizations: shingrix up to date, flu shot up to date, Tdap up to date. Diet: reports healthy diet Exercise: walks regularly Colonoscopy: 09/29/18 Vision: <1 year ago Dental: goes twice a year Wt Readings from Last 3 Encounters:  10/16/19 155 lb (70.3 kg)  10/10/18 160 lb (72.6 kg)  09/29/18 155 lb (70.3 kg)       Review of Systems  Constitutional: Negative for unexpected weight change.  HENT: Negative for hearing loss and rhinorrhea.   Eyes: Negative for visual disturbance.  Respiratory: Negative for cough and shortness of breath.   Cardiovascular: Negative for chest pain.  Gastrointestinal: Negative for blood in stool, constipation and diarrhea.  Genitourinary: Negative for difficulty urinating and frequency.  Musculoskeletal: Negative for arthralgias and myalgias.  Skin: Negative for rash.  Neurological: Negative for headaches.  Hematological: Negative for adenopathy.  Psychiatric/Behavioral:       Denies depression/anxiety   Past Medical History:  Diagnosis Date  . Allergy   . Hearing loss   . Hypercholesteremia   . Hypothyroidism 10/07/2016  . Rhinitis   . Tinnitus, right   . Vestibular schwannoma ([place]) 12/24/12     Social History   Socioeconomic History  . Marital status: Married    Spouse name: Not on file  . Number of children: Not on file  . Years of education: Not on file  . Highest education level: Not on file  Occupational History  . Not on file  Social Needs  . Financial resource strain: Not on file  . Food insecurity    Worry: Not on file    Inability: Not on file  . Transportation needs    Medical: Not on file    Non-medical: Not on file  Tobacco Use  . Smoking status: Never Smoker  . Smokeless tobacco: Never Used  Substance and Sexual Activity  . Alcohol use: Yes    Alcohol/week:  6.0 standard drinks    Types: 6 Standard drinks or equivalent per week  . Drug use: No  . Sexual activity: Not on file  Lifestyle  . Physical activity    Days per week: Not on file    Minutes per session: Not on file  . Stress: Not on file  Relationships  . Social Herbalist on phone: Not on file    Gets together: Not on file    Attends religious service: Not on file    Active member of club or organization: Not on file    Attends meetings of clubs or organizations: Not on file    Relationship status: Not on file  . Intimate partner violence    Fear of current or ex partner: Not on file    Emotionally abused: Not on file    Physically abused: Not on file    Forced sexual activity: Not on file  Other Topics Concern  . 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 COLONOSCOPY  08/31/2013  . NO PAST SURGERIES     denies surgical history  . POLYPECTOMY    . WISDOM  TOOTH EXTRACTION      Family History  Problem Relation Age of Onset  . Hyperlipidemia Mother   . Hypertension Mother   . Hyperlipidemia Father   . Hyperlipidemia Brother   . Other Neg Hx        colon ca, prostate ca  . Colon cancer Neg Hx   . Colon polyps Neg Hx   . Esophageal cancer Neg Hx   . Stomach cancer Neg Hx   . Rectal cancer Neg Hx     Allergies  Allergen Reactions  . Dust Mite Extract     rhinitis  . Molds & Smuts     rhinitis    Current Outpatient Medications on File Prior to Visit  Medication Sig Dispense Refill  . cetirizine (ZYRTEC) 10 MG tablet Take 10 mg by mouth daily as needed for allergies.    . fluticasone (FLONASE) 50 MCG/ACT nasal spray Place 2 sprays into both nostrils daily. 16 g 6  . JUBLIA 10 % SOLN Apply 10 application topically.  2  . levothyroxine  (SYNTHROID) 25 MCG tablet Take 1 tablet (25 mcg total) by mouth daily before breakfast. 90 tablet 1  . simvastatin (ZOCOR) 40 MG tablet TAKE 1 TABLET(40 MG) BY MOUTH DAILY 90 tablet 1   Current Facility-Administered Medications on File Prior to Visit  Medication Dose Route Frequency Provider Last Rate Last Dose  . 0.9 %  sodium chloride infusion  500 mL Intravenous Once Pyrtle, Lajuan Lines, MD        BP 123/83 (BP Location: Right Arm, Patient Position: Sitting, Cuff Size: Small)   Pulse 69   Temp (!) 96.2 F (35.7 C) (Temporal)   Resp 16   Ht 5\' 9"  (1.753 m)   Wt 155 lb (70.3 kg)   SpO2 99%   BMI 22.89 kg/m        Objective:   Physical Exam  Physical Exam  Constitutional: He is oriented to person, place, and time. He appears well-developed and well-nourished. No distress.  HENT:  Head: Normocephalic and atraumatic.  Right Ear: Tympanic membrane and ear canal normal.  Left Ear: Tympanic membrane and ear canal normal.  Mouth/Throat: Oropharynx is clear and moist.  Eyes: Pupils are equal, round, and reactive to light. No scleral icterus.  Neck: Normal range of motion. No thyromegaly present.  Cardiovascular: Normal rate and regular rhythm.   No murmur heard. Pulmonary/Chest: Effort normal and breath sounds normal. No respiratory distress. He has no wheezes. He has no rales. He exhibits no tenderness.  Abdominal: Soft. Bowel sounds are normal. He exhibits no distension and no mass. There is no tenderness. There is no rebound and no guarding.  Musculoskeletal: He exhibits no edema.  Lymphadenopathy:    He has no cervical adenopathy.  Neurological: He is alert and oriented to person, place, and time. He has normal patellar reflexes. He exhibits normal muscle tone. Coordination normal.  Skin: Skin is warm and dry.  Psychiatric: He has a normal mood and affect. His behavior is normal. Judgment and thought content normal.           Assessment & Plan:   Preventative care-  encouraged pt to continue healthy diet, regular exercise. Will obtain routine lab work. He is requesting follow up PSA- has an appointment with urology in 1 month.  Immunizations and colo up to date.       Assessment & Plan:

## 2019-10-16 NOTE — Patient Instructions (Signed)
Please complete lab work prior to leaving. Continue healthy diet and regular exercise. Keep your upcoming appointment with Urology.

## 2019-10-19 ENCOUNTER — Telehealth: Payer: Self-pay | Admitting: Family

## 2019-10-19 ENCOUNTER — Other Ambulatory Visit: Payer: Self-pay

## 2019-10-19 DIAGNOSIS — E039 Hypothyroidism, unspecified: Secondary | ICD-10-CM

## 2019-10-19 MED ORDER — LEVOTHYROXINE SODIUM 50 MCG PO TABS: 50.0000 ug | ORAL_TABLET | Freq: Every day | ORAL | 3 refills | Status: DC

## 2019-10-19 NOTE — Telephone Encounter (Signed)
Please advise pt that lab work shows that his thyroid medication should be increased. Please increase synthroid from 60mcg to 55mcg once daily. Follow up TSH in 6 weeks please dx hypothyroid.

## 2019-10-19 NOTE — Telephone Encounter (Signed)
Advised patient of results and new rx. Scheduled to repeat tsh 12-07-2019

## 2019-11-03 ENCOUNTER — Encounter: Payer: Self-pay | Admitting: Family

## 2019-11-09 DIAGNOSIS — R972 Elevated prostate specific antigen [PSA]: Secondary | ICD-10-CM | POA: Diagnosis not present

## 2019-11-09 DIAGNOSIS — N400 Enlarged prostate without lower urinary tract symptoms: Secondary | ICD-10-CM | POA: Diagnosis not present

## 2019-11-30 DIAGNOSIS — Z20828 Contact with and (suspected) exposure to other viral communicable diseases: Secondary | ICD-10-CM | POA: Diagnosis not present

## 2019-12-07 ENCOUNTER — Other Ambulatory Visit (INDEPENDENT_AMBULATORY_CARE_PROVIDER_SITE_OTHER): Payer: BLUE CROSS/BLUE SHIELD

## 2019-12-07 ENCOUNTER — Other Ambulatory Visit: Payer: Self-pay

## 2019-12-07 DIAGNOSIS — E039 Hypothyroidism, unspecified: Secondary | ICD-10-CM

## 2019-12-07 LAB — TSH: TSH: 2.7 u[IU]/mL (ref 0.35–4.50)

## 2019-12-07 NOTE — Addendum Note (Signed)
Addended by: Caffie Pinto on: 12/07/2019 07:48 AM   Modules accepted: Orders

## 2019-12-08 ENCOUNTER — Encounter: Payer: Self-pay | Admitting: Family

## 2020-01-19 DIAGNOSIS — H5203 Hypermetropia, bilateral: Secondary | ICD-10-CM | POA: Diagnosis not present

## 2020-02-23 ENCOUNTER — Encounter: Payer: Self-pay | Admitting: Family

## 2020-02-23 MED ORDER — LEVOTHYROXINE SODIUM 50 MCG PO TABS: 50.0000 ug | ORAL_TABLET | Freq: Every day | ORAL | 3 refills | Status: DC

## 2020-02-23 MED ORDER — SIMVASTATIN 40 MG PO TABS: 40.0000 mg | ORAL_TABLET | Freq: Every day | ORAL | 3 refills | Status: DC

## 2020-03-25 DIAGNOSIS — D333 Benign neoplasm of cranial nerves: Secondary | ICD-10-CM | POA: Diagnosis not present

## 2020-04-01 DIAGNOSIS — D333 Benign neoplasm of cranial nerves: Secondary | ICD-10-CM | POA: Diagnosis not present

## 2020-04-01 DIAGNOSIS — H918X1 Other specified hearing loss, right ear: Secondary | ICD-10-CM | POA: Diagnosis not present

## 2020-04-18 DIAGNOSIS — L239 Allergic contact dermatitis, unspecified cause: Secondary | ICD-10-CM | POA: Diagnosis not present

## 2020-09-20 DIAGNOSIS — D225 Melanocytic nevi of trunk: Secondary | ICD-10-CM | POA: Diagnosis not present

## 2020-09-20 DIAGNOSIS — L814 Other melanin hyperpigmentation: Secondary | ICD-10-CM | POA: Diagnosis not present

## 2020-09-20 DIAGNOSIS — L821 Other seborrheic keratosis: Secondary | ICD-10-CM | POA: Diagnosis not present

## 2020-09-20 DIAGNOSIS — L905 Scar conditions and fibrosis of skin: Secondary | ICD-10-CM | POA: Diagnosis not present

## 2020-10-17 ENCOUNTER — Other Ambulatory Visit: Payer: Self-pay

## 2020-10-17 ENCOUNTER — Ambulatory Visit (INDEPENDENT_AMBULATORY_CARE_PROVIDER_SITE_OTHER): Payer: BLUE CROSS/BLUE SHIELD | Admitting: Family

## 2020-10-17 ENCOUNTER — Encounter: Payer: Self-pay | Admitting: Family

## 2020-10-17 VITALS — BP 122/75 | HR 87 | Temp 98.4°F | Resp 12 | Ht 69.0 in | Wt 158.4 lb

## 2020-10-17 DIAGNOSIS — E1169 Type 2 diabetes mellitus with other specified complication: Secondary | ICD-10-CM | POA: Diagnosis not present

## 2020-10-17 DIAGNOSIS — Z0000 Encounter for general adult medical examination without abnormal findings: Secondary | ICD-10-CM | POA: Diagnosis not present

## 2020-10-17 DIAGNOSIS — E039 Hypothyroidism, unspecified: Secondary | ICD-10-CM

## 2020-10-17 DIAGNOSIS — E785 Hyperlipidemia, unspecified: Secondary | ICD-10-CM | POA: Diagnosis not present

## 2020-10-17 NOTE — Progress Notes (Signed)
Subjective:    Patient ID: Thomas Valencia, male    DOB: July 01, 1961, 59 y.o.   MRN: [id number]  HPI  Patient presents today for complete physical.  Immunizations:  Had 1 J & J, flu shot last week, Tdap 2014, Shingrix complete Diet: healthy Exercise:  Better- walks 4 miles severa times a week Colonoscopy: 10/19 Vision: up to date Dental: up to date Wt Readings from Last 3 Encounters:  10/17/20 158 lb 6.4 oz (71.8 kg)  10/16/19 155 lb (70.3 kg)  10/10/18 160 lb (72.6 kg)     Following with urology.  Last appointment 11/20 Lab Results  Component Value Date   PSA 3.64 10/16/2019   PSA 5.85 (H) 10/10/2018   PSA 4.08 (H) 10/11/2017     Review of Systems  Constitutional: Negative for unexpected weight change.  HENT: Positive for hearing loss (sees ENT at [facility]). Negative for rhinorrhea.   Eyes: Negative for visual disturbance.  Respiratory: Negative for cough and shortness of breath.   Cardiovascular: Negative for chest pain.  Gastrointestinal: Negative for blood in stool, constipation and diarrhea.  Genitourinary: Negative for difficulty urinating, dysuria, frequency and hematuria.  Musculoskeletal: Negative for arthralgias and myalgias.  Skin: Negative for rash.  Neurological: Negative for headaches.  Hematological: Negative for adenopathy.  Psychiatric/Behavioral:       Denies depression/anxiety   Past Medical History:  Diagnosis Date  . Allergy   . Hearing loss   . Hypercholesteremia   . Hypothyroidism 10/07/2016  . Rhinitis   . Tinnitus, right   . Vestibular schwannoma ([facility]) 12/24/12     Social History   Socioeconomic History  . Marital status: Married    Spouse name: Not on file  . Number of children: Not on file  . Years of education: Not on file  . Highest education level: Not on file  Occupational History  . Not on file  Tobacco Use  . Smoking status: Never Smoker  . Smokeless tobacco: Never Used  Vaping Use  . Vaping Use: Never used    Substance and Sexual Activity  . Alcohol use: Yes    Alcohol/week: 6.0 standard drinks    Types: 6 Standard drinks or equivalent per week  . Drug use: No  . Sexual activity: Not on file  Other Topics Concern  . 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 Difficulty of Paying Living Expenses: Not on file  Food Insecurity:   . Worried About Charity fundraiser in the Last Year: Not on file  . Ran Out of Food in the Last Year: Not on file  Transportation Needs:   . Lack of Transportation (Medical): Not on file  . Lack of Transportation (Non-Medical): Not on file  Physical Activity:   . Days of Exercise per Week: Not on file  . Minutes of Exercise per Session: Not on file  Stress:   . Feeling of Stress : Not on file  Social Connections:   . Frequency of Communication with Friends and Family: Not on file  . Frequency of Social Gatherings with Friends and Family: Not on file  . Attends Religious Services: Not on file  . Active Member of Clubs or Organizations: Not on file  .  Attends Archivist Meetings: Not on file  . Marital Status: Not on file  Intimate Partner Violence:   . Fear of Current or Ex-Partner: Not on file  . Emotionally Abused: Not on file  . Physically Abused: Not on file  . Sexually Abused: Not on file    Past Surgical History:  Procedure Laterality Date  . COLONOSCOPY  08/31/2013  . NO PAST SURGERIES     denies surgical history  . POLYPECTOMY    . WISDOM TOOTH EXTRACTION      Family History  Problem Relation Age of Onset  . Hyperlipidemia Mother   . Hypertension Mother   . Hyperlipidemia Father   . Hyperlipidemia Brother   . Other Neg Hx        colon ca, prostate ca  . Colon cancer Neg Hx   . Colon polyps Neg Hx   .  Esophageal cancer Neg Hx   . Stomach cancer Neg Hx   . Rectal cancer Neg Hx     Allergies  Allergen Reactions  . Dust Mite Extract     rhinitis  . Molds & Smuts     rhinitis    Current Outpatient Medications on File Prior to Visit  Medication Sig Dispense Refill  . cetirizine (ZYRTEC) 10 MG tablet Take 10 mg by mouth daily as needed for allergies.    . fluticasone (FLONASE) 50 MCG/ACT nasal spray Place 2 sprays into both nostrils daily. 16 g 6  . JUBLIA 10 % SOLN Apply 10 application topically.  2  . levothyroxine (SYNTHROID) 50 MCG tablet Take 1 tablet (50 mcg total) by mouth daily before breakfast. 90 tablet 3  . simvastatin (ZOCOR) 40 MG tablet Take 1 tablet (40 mg total) by mouth daily. 90 tablet 3   Current Facility-Administered Medications on File Prior to Visit  Medication Dose Route Frequency Provider Last Rate Last Admin  . 0.9 %  sodium chloride infusion  500 mL Intravenous Once Pyrtle, Lajuan Lines, MD        BP 122/75 (BP Location: Right Arm, Cuff Size: Normal)   Pulse 87   Temp 98.4 F (36.9 C) (Oral)   Resp 12   Ht 5\' 9"  (1.753 m)   Wt 158 lb 6.4 oz (71.8 kg)   SpO2 97%   BMI 23.39 kg/m       Objective:   Physical Exam  Physical Exam  Constitutional: He is oriented to person, place, and time. He appears well-developed and well-nourished. No distress.  HENT:  Head: Normocephalic and atraumatic.  Right Ear: Tympanic membrane and ear canal normal.  Left Ear: Tympanic membrane and ear canal normal.  Mouth/Throat: Not examined- pt wearing mask Eyes: Pupils are equal, round, and reactive to light. No scleral icterus.  Neck: Normal range of motion. No thyromegaly present.  Cardiovascular: Normal rate and regular rhythm.   No murmur heard. Pulmonary/Chest: Effort normal and breath sounds normal. No respiratory distress. He has no wheezes. He has no rales. He exhibits no tenderness.  Abdominal: Soft. Bowel sounds are normal. He exhibits no distension and no mass.  There is no tenderness. There is no rebound and no guarding.  Musculoskeletal: He exhibits no edema.  Lymphadenopathy:    He has no cervical adenopathy.  Neurological: He is alert and oriented to person, place, and time.  He exhibits normal muscle tone. Coordination normal.  Skin: Skin is warm and dry.  Psychiatric: He has a normal mood and affect. His behavior is normal. Judgment  and thought content normal.           Assessment & Plan:   Preventative care- encouraged pt to continue healthy diet, exercise.  He plans to get J and J booster tomorrow.  Tetanus and flu shot are up to date. He will have follow up psa drawn next month at Urology.  This visit occurred during the SARS-CoV-2 public health emergency.  Safety protocols were in place, including screening questions prior to the visit, additional usage of staff PPE, and extensive cleaning of exam room while observing appropriate contact time as indicated for disinfecting solutions.         Assessment & Plan:

## 2020-10-17 NOTE — Patient Instructions (Addendum)
Please complete lab work prior to leaving.  Preventive Care 40-58 Years Old, Male Preventive care refers to lifestyle choices and visits with your health care provider that can promote health and wellness. This includes:  A yearly physical exam. This is also called an annual well check.  Regular dental and eye exams.  Immunizations.  Screening for certain conditions.  Healthy lifestyle choices, such as eating a healthy diet, getting regular exercise, not using drugs or products that contain nicotine and tobacco, and limiting alcohol use. What can I expect for my preventive care visit? Physical exam Your health care provider will check:  Height and weight. These may be used to calculate body mass index (BMI), which is a measurement that tells if you are at a healthy weight.  Heart rate and blood pressure.  Your skin for abnormal spots. Counseling Your health care provider may ask you questions about:  Alcohol, tobacco, and drug use.  Emotional well-being.  Home and relationship well-being.  Sexual activity.  Eating habits.  Work and work environment. What immunizations do I need?  Influenza (flu) vaccine  This is recommended every year. Tetanus, diphtheria, and pertussis (Tdap) vaccine  You may need a Td booster every 10 years. Varicella (chickenpox) vaccine  You may need this vaccine if you have not already been vaccinated. Zoster (shingles) vaccine  You may need this after age 60. Measles, mumps, and rubella (MMR) vaccine  You may need at least one dose of MMR if you were born in 1957 or later. You may also need a second dose. Pneumococcal conjugate (PCV13) vaccine  You may need this if you have certain conditions and were not previously vaccinated. Pneumococcal polysaccharide (PPSV23) vaccine  You may need one or two doses if you smoke cigarettes or if you have certain conditions. Meningococcal conjugate (MenACWY) vaccine  You may need this if you have  certain conditions. Hepatitis A vaccine  You may need this if you have certain conditions or if you travel or work in places where you may be exposed to hepatitis A. Hepatitis B vaccine  You may need this if you have certain conditions or if you travel or work in places where you may be exposed to hepatitis B. Haemophilus influenzae type b (Hib) vaccine  You may need this if you have certain risk factors. Human papillomavirus (HPV) vaccine  If recommended by your health care provider, you may need three doses over 6 months. You may receive vaccines as individual doses or as more than one vaccine together in one shot (combination vaccines). Talk with your health care provider about the risks and benefits of combination vaccines. What tests do I need? Blood tests  Lipid and cholesterol levels. These may be checked every 5 years, or more frequently if you are over 50 years old.  Hepatitis C test.  Hepatitis B test. Screening  Lung cancer screening. You may have this screening every year starting at age 55 if you have a 30-pack-year history of smoking and currently smoke or have quit within the past 15 years.  Prostate cancer screening. Recommendations will vary depending on your family history and other risks.  Colorectal cancer screening. All adults should have this screening starting at age 50 and continuing until age 75. Your health care provider may recommend screening at age 45 if you are at increased risk. You will have tests every 1-10 years, depending on your results and the type of screening test.  Diabetes screening. This is done by checking your   blood sugar (glucose) after you have not eaten for a while (fasting). You may have this done every 1-3 years.  Sexually transmitted disease (STD) testing. Follow these instructions at home: Eating and drinking  Eat a diet that includes fresh fruits and vegetables, whole grains, lean protein, and low-fat dairy products.  Take  vitamin and mineral supplements as recommended by your health care provider.  Do not drink alcohol if your health care provider tells you not to drink.  If you drink alcohol: ? Limit how much you have to 0-2 drinks a day. ? Be aware of how much alcohol is in your drink. In the [place] one drink equals one 12 oz bottle of beer (355 mL), one 5 oz glass of wine (148 mL), or one 1 oz glass of hard liquor (44 mL). Lifestyle  Take daily care of your teeth and gums.  Stay active. Exercise for at least 30 minutes on 5 or more days each week.  Do not use any products that contain nicotine or tobacco, such as cigarettes, e-cigarettes, and chewing tobacco. If you need help quitting, ask your health care provider.  If you are sexually active, practice safe sex. Use a condom or other form of protection to prevent STIs (sexually transmitted infections).  Talk with your health care provider about taking a low-dose aspirin every day starting at age 50. What's next?  Go to your health care provider once a year for a well check visit.  Ask your health care provider how often you should have your eyes and teeth checked.  Stay up to date on all vaccines. This information is not intended to replace advice given to you by your health care provider. Make sure you discuss any questions you have with your health care provider. Document Revised: 12/04/2018 Document Reviewed: 12/04/2018 Elsevier Patient Education  2020 Elsevier Inc.  

## 2020-10-18 LAB — TSH: TSH: 2.32 mIU/L (ref 0.40–4.50)

## 2020-10-18 LAB — COMPREHENSIVE METABOLIC PANEL
AG Ratio: 1.8 (calc) (ref 1.0–2.5)
ALT: 15 U/L (ref 9–46)
AST: 17 U/L (ref 10–35)
Albumin: 4.5 g/dL (ref 3.6–5.1)
Alkaline phosphatase (APISO): 56 U/L (ref 35–144)
BUN: 19 mg/dL (ref 7–25)
CO2: 28 mmol/L (ref 20–32)
Calcium: 9.6 mg/dL (ref 8.6–10.3)
Chloride: 104 mmol/L (ref 98–110)
Creat: 0.96 mg/dL (ref 0.70–1.33)
Globulin: 2.5 g/dL (calc) (ref 1.9–3.7)
Glucose, Bld: 96 mg/dL (ref 65–99)
Potassium: 4.9 mmol/L (ref 3.5–5.3)
Sodium: 141 mmol/L (ref 135–146)
Total Bilirubin: 0.7 mg/dL (ref 0.2–1.2)
Total Protein: 7 g/dL (ref 6.1–8.1)

## 2020-10-18 LAB — LIPID PANEL
Cholesterol: 206 mg/dL — ABNORMAL HIGH (ref <200)
HDL: 41 mg/dL (ref >=40)
LDL Cholesterol (Calc): 125 mg/dL (calc) — ABNORMAL HIGH
Non-HDL Cholesterol (Calc): 165 mg/dL (calc) — ABNORMAL HIGH (ref <130)
Total CHOL/HDL Ratio: 5 (calc) — ABNORMAL HIGH (ref <5.0)
Triglycerides: 247 mg/dL — ABNORMAL HIGH (ref <150)

## 2020-11-15 ENCOUNTER — Encounter: Payer: Self-pay | Admitting: Family

## 2020-11-15 MED ORDER — ALBUTEROL SULFATE HFA 108 (90 BASE) MCG/ACT IN AERS: 2.0000 | INHALATION_SPRAY | Freq: Four times a day (QID) | RESPIRATORY_TRACT | 0 refills | Status: DC | PRN

## 2020-11-23 DIAGNOSIS — R351 Nocturia: Secondary | ICD-10-CM | POA: Diagnosis not present

## 2020-11-23 DIAGNOSIS — N401 Enlarged prostate with lower urinary tract symptoms: Secondary | ICD-10-CM | POA: Diagnosis not present

## 2020-11-23 DIAGNOSIS — R972 Elevated prostate specific antigen [PSA]: Secondary | ICD-10-CM | POA: Diagnosis not present

## 2020-12-26 DIAGNOSIS — N401 Enlarged prostate with lower urinary tract symptoms: Secondary | ICD-10-CM | POA: Diagnosis not present

## 2020-12-26 DIAGNOSIS — R351 Nocturia: Secondary | ICD-10-CM | POA: Diagnosis not present

## 2020-12-26 LAB — PSA: PSA: 6.79

## 2020-12-28 ENCOUNTER — Encounter: Payer: Self-pay | Admitting: Family

## 2020-12-29 DIAGNOSIS — Z20822 Contact with and (suspected) exposure to covid-19: Secondary | ICD-10-CM | POA: Diagnosis not present

## 2021-01-06 ENCOUNTER — Other Ambulatory Visit: Payer: Self-pay | Admitting: Urology

## 2021-01-06 DIAGNOSIS — R972 Elevated prostate specific antigen [PSA]: Secondary | ICD-10-CM

## 2021-01-24 ENCOUNTER — Ambulatory Visit
Admission: RE | Admit: 2021-01-24 | Discharge: 2021-01-24 | Disposition: A | Discharge status: Home / Self Care | Payer: BLUE CROSS/BLUE SHIELD | Source: Ambulatory Visit | Attending: Urology | Admitting: Urology

## 2021-01-24 ENCOUNTER — Other Ambulatory Visit: Payer: Self-pay

## 2021-01-24 DIAGNOSIS — H5203 Hypermetropia, bilateral: Secondary | ICD-10-CM | POA: Diagnosis not present

## 2021-01-24 DIAGNOSIS — R972 Elevated prostate specific antigen [PSA]: Secondary | ICD-10-CM | POA: Diagnosis not present

## 2021-01-24 MED ORDER — GADOBENATE DIMEGLUMINE 529 MG/ML IV SOLN
14.0000 mL | Freq: Once | INTRAVENOUS | Status: AC | PRN
  Administered 2021-01-24: 14 mL via INTRAVENOUS

## 2021-01-27 DIAGNOSIS — R972 Elevated prostate specific antigen [PSA]: Secondary | ICD-10-CM | POA: Diagnosis not present

## 2021-03-03 ENCOUNTER — Other Ambulatory Visit: Payer: Self-pay | Admitting: Family

## 2021-05-01 ENCOUNTER — Encounter: Payer: Self-pay | Admitting: Family

## 2021-05-01 MED ORDER — ALBUTEROL SULFATE HFA 108 (90 BASE) MCG/ACT IN AERS: 2.0000 | INHALATION_SPRAY | Freq: Four times a day (QID) | RESPIRATORY_TRACT | 5 refills | Status: DC | PRN

## 2021-05-24 ENCOUNTER — Other Ambulatory Visit: Payer: Self-pay | Admitting: Family

## 2021-05-26 ENCOUNTER — Other Ambulatory Visit: Payer: Self-pay | Admitting: *Deleted

## 2021-05-26 MED ORDER — SIMVASTATIN 40 MG PO TABS: ORAL_TABLET | ORAL | 1 refills | Status: DC

## 2021-05-29 ENCOUNTER — Other Ambulatory Visit: Payer: Self-pay | Admitting: Family

## 2021-05-29 ENCOUNTER — Telehealth: Payer: Self-pay

## 2021-05-29 NOTE — Telephone Encounter (Signed)
Called patient to get him scheduled, no answer. Left detail voice mail for him to call us back.

## 2021-05-29 NOTE — Telephone Encounter (Signed)
[facility] - Client Client Site [facility] - Night Physician Debbrah Alar - NP Contact Type Call Who Is Calling Patient / Member / Family / Caregiver Caller Name Vondell Babers Caller Phone Number [phone] Patient Name Ioane Bhola Patient DOB 1961-11-05 Call Type Message Only Information Provided Reason for Call Request to Schedule Office Appointment Initial Comment Caller states he wants an appt. He has breathing problems, chest congestion, Melissa prescribed something and wants to see her. Patient request to speak to RN No Additional Comment Declined triage.

## 2021-05-30 ENCOUNTER — Other Ambulatory Visit: Payer: Self-pay | Admitting: Medical

## 2021-05-30 ENCOUNTER — Ambulatory Visit: Payer: BLUE CROSS/BLUE SHIELD | Admitting: Medical

## 2021-05-30 ENCOUNTER — Encounter: Payer: Self-pay | Admitting: Medical

## 2021-05-30 ENCOUNTER — Other Ambulatory Visit: Payer: Self-pay

## 2021-05-30 ENCOUNTER — Ambulatory Visit (HOSPITAL_BASED_OUTPATIENT_CLINIC_OR_DEPARTMENT_OTHER)
Admission: RE | Admit: 2021-05-30 | Discharge: 2021-05-30 | Disposition: A | Discharge status: Home / Self Care | Payer: BLUE CROSS/BLUE SHIELD | Source: Ambulatory Visit | Attending: Medical | Admitting: Medical

## 2021-05-30 VITALS — BP 120/90 | HR 95 | Resp 20 | Ht 69.0 in | Wt 158.0 lb

## 2021-05-30 DIAGNOSIS — K219 Gastro-esophageal reflux disease without esophagitis: Secondary | ICD-10-CM | POA: Diagnosis not present

## 2021-05-30 DIAGNOSIS — R062 Wheezing: Secondary | ICD-10-CM

## 2021-05-30 DIAGNOSIS — H00011 Hordeolum externum right upper eyelid: Secondary | ICD-10-CM

## 2021-05-30 MED ORDER — TOBRAMYCIN 0.3 % OP SOLN: 2.0000 [drp] | Freq: Four times a day (QID) | OPHTHALMIC | 0 refills | Status: DC

## 2021-05-30 MED ORDER — FAMOTIDINE 20 MG PO TABS: 20.0000 mg | ORAL_TABLET | Freq: Two times a day (BID) | ORAL | 0 refills | Status: DC

## 2021-05-30 MED ORDER — BUDESONIDE-FORMOTEROL FUMARATE 160-4.5 MCG/ACT IN AERO: 2.0000 | INHALATION_SPRAY | Freq: Two times a day (BID) | RESPIRATORY_TRACT | 0 refills | Status: DC

## 2021-05-30 NOTE — Patient Instructions (Addendum)
History of intermittent wheezing for about 6 to 7 months.  Worse over the last month.  Will get chest x-ray today, prescribe Symbicort inhaler, get chest x-ray and refer to pulmonologist.  Pulmonologist may get pulmonary function test.  Only use albuterol as a backup/if needed.  Expect when  using Symbicort wheezing will be controlled.  History of some reflux about 3 times a week.  Eat very healthy diet and prescribed famotidine to help control reflux.  Reflux may be a factor in your wheezing.  History of allergic rhinitis and continue daily antihistamine and Nasonex nasal spray.  Appear to have recent small right upper eyelid stye.  I will prescribe Tobrex eyedrops and recommend warm compresses twice daily.  If stye does not flatten out Thomas Valencia let me know and we will refer to ophthalmologist.  Follow-up in 2 to 3 weeks or as needed.

## 2021-05-30 NOTE — Progress Notes (Signed)
Subjective:    Patient ID: Thomas Valencia, male    DOB: November 09, 1961, 60 y.o.   MRN: [id number]  HPI  Pt in for some recent mild slight abnormal breath sounds in past  wellness exam he thought provider mentioned abnormal breath sounds. Then last November he stated some mild wheezing and he had albuterol sent in.   Pt has been using albuterol sporadically over past 6 months. Initially used at night twice a month for mild wheezing. But over past month using albuterol at night about 5 times a week.   No history of smoking in the past. No history of asthma as child.   He states for 6 months has been coughing up thick clear mucus.   Pt states when he exercises does not get short of breath.   Pt has history of allergies. He takes otc antihistamines. Also uses nasonex.   Pt does have reflux history of reflux. Gets that about 2-3 times a week. Takes tums.   Pt has no history of covid.  1 week small lump rt upper eye lid.    Review of Systems  Constitutional: Negative for chills, fatigue and fever.  HENT: Negative for congestion, drooling, ear pain, hearing loss and mouth sores.   Eyes:       Right upper eyelid stye/small lump  Respiratory: Positive for wheezing. Negative for cough, chest tightness and shortness of breath.   Cardiovascular: Negative for chest pain and palpitations.  Gastrointestinal: Negative for abdominal pain.       Reflux  Genitourinary: Negative for flank pain.  Musculoskeletal: Negative for back pain.  Skin: Negative for rash.  Neurological: Negative for facial asymmetry and weakness.  Hematological: Negative for adenopathy. Does not bruise/bleed easily.  Psychiatric/Behavioral: Negative for behavioral problems, confusion, dysphoric mood and suicidal ideas. The patient is not nervous/anxious.     Past Medical History:  Diagnosis Date  . Allergy   . Hearing loss   . Hypercholesteremia   . Hypothyroidism 10/07/2016  . Rhinitis   . Tinnitus, right   .  Vestibular schwannoma ([facility]) 12/24/12     Social History   Socioeconomic History  . Marital status: Married    Spouse name: Not on file  . Number of children: Not on file  . Years of education: Not on file  . Highest education level: Not on file  Occupational History  . Not on file  Tobacco Use  . Smoking status: Never Smoker  . Smokeless tobacco: Never Used  Vaping Use  . Vaping Use: Never used  Substance and Sexual Activity  . Alcohol use: Yes    Alcohol/week: 6.0 standard drinks    Types: 6 Standard drinks or equivalent per week  . Drug use: No  . Sexual activity: Not on file  Other Topics Concern  . 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 COLONOSCOPY  08/31/2013  . NO PAST SURGERIES     denies surgical history  .  POLYPECTOMY    . WISDOM TOOTH EXTRACTION      Family History  Problem Relation Age of Onset  . Hyperlipidemia Mother   . Hypertension Mother   . Hyperlipidemia Father   . Hyperlipidemia Brother   . Other Neg Hx        colon ca, prostate ca  . Colon cancer Neg Hx   . Colon polyps Neg Hx   . Esophageal cancer Neg Hx   . Stomach cancer Neg Hx   . Rectal cancer Neg Hx     Allergies  Allergen Reactions  . Dust Mite Extract     rhinitis  . Molds & Smuts     rhinitis    Current Outpatient Medications on File Prior to Visit  Medication Sig Dispense Refill  . albuterol (VENTOLIN HFA) 108 (90 Base) MCG/ACT inhaler Inhale 2 puffs into the lungs every 6 (six) hours as needed  for wheezing or shortness of breath. 18 g 5  . fluticasone (FLONASE) 50 MCG/ACT nasal spray Place 2 sprays into both nostrils daily. 16 g 6  . levothyroxine (SYNTHROID) 50 MCG tablet TAKE 1 TABLET(50 MCG) BY MOUTH DAILY BEFORE AND BREAKFAST 30 tablet 0  . simvastatin (ZOCOR) 40 MG tablet TAKE 1 TABLET(40 MG) BY MOUTH DAILY 90 tablet 1  . cetirizine (ZYRTEC) 10 MG tablet Take 10 mg by mouth daily as needed for allergies.    . JUBLIA 10 % SOLN Apply 10 application topically.  2   Current Facility-Administered Medications on File Prior to Visit  Medication Dose Route Frequency Provider Last Rate Last Admin  . 0.9 %  sodium chloride infusion  500 mL Intravenous Once Pyrtle, Lajuan Lines, MD        BP 120/90   Pulse 95   Resp 20   Ht 5\' 9"  (1.753 m)   Wt 158 lb (71.7 kg)   SpO2 97%   BMI 23.33 kg/m      Objective:   Physical Exam   General- No acute distress. Pleasant patient. Neck- Full range of motion, no jvd Lungs- even and unlabored but scattered mild expiratory Heart- regular rate and rhythm. Neurologic- CNII- XII grossly intact.  Rt eye- upper lid. Medial aspect small stye.    Assessment & Plan:  History of intermittent wheezing for about 6 to 7 months.  Worse over the last month.  Will get chest x-ray today, prescribe Symbicort inhaler, get chest x-ray and refer to pulmonologist.  Pulmonologist may get pulmonary function test.  Only use albuterol as a backup/if needed.  Expect when using Symbicort wheezing will be controlled.  History of some reflux about 3 times a week.  Eat very healthy diet and prescribed famotidine to help control reflux.  Reflux may be a factor in your wheezing.  History of allergic rhinitis and continue daily antihistamine and Nasonex nasal spray.  Appear to have recent small right upper eyelid stye.  I will prescribe Tobrex eyedrops and recommend warm compresses twice daily.  If stye does not flatten out Chesley Noon let me know and we will refer to  ophthalmologist.  Follow-up in 2 to 3 weeks or as needed.  Mackie Pai, PA-C

## 2021-05-30 NOTE — Telephone Encounter (Signed)
Patient received message yesterday and was seen today by Mackie Pai PA

## 2021-06-06 ENCOUNTER — Other Ambulatory Visit: Payer: Self-pay

## 2021-06-06 ENCOUNTER — Encounter: Payer: Self-pay | Admitting: Family

## 2021-06-06 MED ORDER — LEVOTHYROXINE SODIUM 50 MCG PO TABS
50.0000 ug | ORAL_TABLET | Freq: Every day | ORAL | 0 refills | Status: DC
Start: 2021-06-06 — End: 2021-09-21

## 2021-06-30 ENCOUNTER — Encounter: Payer: Self-pay | Admitting: Family

## 2021-06-30 MED ORDER — MOLNUPIRAVIR 200 MG PO CAPS: 800.0000 mg | ORAL_CAPSULE | Freq: Two times a day (BID) | ORAL | 0 refills | Status: AC

## 2021-08-07 DIAGNOSIS — N401 Enlarged prostate with lower urinary tract symptoms: Secondary | ICD-10-CM | POA: Diagnosis not present

## 2021-08-07 DIAGNOSIS — R351 Nocturia: Secondary | ICD-10-CM | POA: Diagnosis not present

## 2021-09-21 ENCOUNTER — Other Ambulatory Visit: Payer: Self-pay | Admitting: Family

## 2021-09-21 DIAGNOSIS — L821 Other seborrheic keratosis: Secondary | ICD-10-CM | POA: Diagnosis not present

## 2021-09-21 DIAGNOSIS — L570 Actinic keratosis: Secondary | ICD-10-CM | POA: Diagnosis not present

## 2021-09-21 DIAGNOSIS — D225 Melanocytic nevi of trunk: Secondary | ICD-10-CM | POA: Diagnosis not present

## 2021-09-21 DIAGNOSIS — L308 Other specified dermatitis: Secondary | ICD-10-CM | POA: Diagnosis not present

## 2021-09-21 DIAGNOSIS — L814 Other melanin hyperpigmentation: Secondary | ICD-10-CM | POA: Diagnosis not present

## 2021-09-27 ENCOUNTER — Other Ambulatory Visit: Payer: Self-pay | Admitting: Medical

## 2021-09-27 MED ORDER — BUDESONIDE-FORMOTEROL FUMARATE 160-4.5 MCG/ACT IN AERO: 2.0000 | INHALATION_SPRAY | Freq: Two times a day (BID) | RESPIRATORY_TRACT | 0 refills | Status: DC

## 2021-09-30 ENCOUNTER — Other Ambulatory Visit: Payer: Self-pay | Admitting: Family

## 2021-10-02 ENCOUNTER — Encounter: Payer: Self-pay | Admitting: Family

## 2021-10-06 DIAGNOSIS — R972 Elevated prostate specific antigen [PSA]: Secondary | ICD-10-CM | POA: Diagnosis not present

## 2021-10-31 ENCOUNTER — Other Ambulatory Visit: Payer: Self-pay

## 2021-10-31 ENCOUNTER — Encounter: Payer: Self-pay | Admitting: Family

## 2021-10-31 ENCOUNTER — Ambulatory Visit (INDEPENDENT_AMBULATORY_CARE_PROVIDER_SITE_OTHER): Payer: BLUE CROSS/BLUE SHIELD | Admitting: Family

## 2021-10-31 VITALS — BP 126/88 | HR 81 | Temp 98.6°F | Resp 16 | Ht 69.0 in | Wt 157.0 lb

## 2021-10-31 DIAGNOSIS — R972 Elevated prostate specific antigen [PSA]: Secondary | ICD-10-CM

## 2021-10-31 DIAGNOSIS — E039 Hypothyroidism, unspecified: Secondary | ICD-10-CM | POA: Diagnosis not present

## 2021-10-31 DIAGNOSIS — J4520 Mild intermittent asthma, uncomplicated: Secondary | ICD-10-CM | POA: Diagnosis not present

## 2021-10-31 DIAGNOSIS — Z0000 Encounter for general adult medical examination without abnormal findings: Secondary | ICD-10-CM

## 2021-10-31 DIAGNOSIS — K219 Gastro-esophageal reflux disease without esophagitis: Secondary | ICD-10-CM

## 2021-10-31 DIAGNOSIS — E7800 Pure hypercholesterolemia, unspecified: Secondary | ICD-10-CM

## 2021-10-31 DIAGNOSIS — J45909 Unspecified asthma, uncomplicated: Secondary | ICD-10-CM

## 2021-10-31 HISTORY — DX: Unspecified asthma, uncomplicated: J45.909

## 2021-10-31 LAB — COMPREHENSIVE METABOLIC PANEL
ALT: 19 U/L (ref 0–53)
AST: 20 U/L (ref 0–37)
Albumin: 4.6 g/dL (ref 3.5–5.2)
Alkaline Phosphatase: 60 U/L (ref 39–117)
BUN: 20 mg/dL (ref 6–23)
CO2: 30 mEq/L (ref 19–32)
Calcium: 9.6 mg/dL (ref 8.4–10.5)
Chloride: 102 mEq/L (ref 96–112)
Creatinine, Ser: 0.96 mg/dL (ref 0.40–1.50)
GFR: 86.6 mL/min (ref >60.00)
Glucose, Bld: 97 mg/dL (ref 70–99)
Potassium: 4.5 mEq/L (ref 3.5–5.1)
Sodium: 139 mEq/L (ref 135–145)
Total Bilirubin: 0.8 mg/dL (ref 0.2–1.2)
Total Protein: 6.9 g/dL (ref 6.0–8.3)

## 2021-10-31 LAB — LIPID PANEL
Cholesterol: 186 mg/dL (ref 0–200)
HDL: 50.2 mg/dL (ref >39.00)
LDL Cholesterol: 113 mg/dL — ABNORMAL HIGH (ref 0–99)
NonHDL: 135.48
Total CHOL/HDL Ratio: 4
Triglycerides: 113 mg/dL (ref 0.0–149.0)
VLDL: 22.6 mg/dL (ref 0.0–40.0)

## 2021-10-31 LAB — TSH: TSH: 1.54 u[IU]/mL (ref 0.35–5.50)

## 2021-10-31 MED ORDER — TAMSULOSIN HCL 0.4 MG PO CAPS: 0.4000 mg | ORAL_CAPSULE | Freq: Every day | ORAL | 3 refills | Status: DC

## 2021-10-31 MED ORDER — FAMOTIDINE 10 MG PO TABS: 10.0000 mg | ORAL_TABLET | Freq: Two times a day (BID) | ORAL | Status: DC

## 2021-10-31 MED ORDER — ALBUTEROL SULFATE HFA 108 (90 BASE) MCG/ACT IN AERS
2.0000 | INHALATION_SPRAY | Freq: Four times a day (QID) | RESPIRATORY_TRACT | 5 refills | Status: DC | PRN
Start: 2021-10-31 — End: 2022-11-20

## 2021-10-31 NOTE — Assessment & Plan Note (Signed)
Stable. Not using symbicort regularly. Pt is advised as follows:   Stop symbicort. Continue albuterol (ventolin) every 6 hours as needed for shortness of breath or wheezing as needed. Call if you are needing to use the albuterol/ventolin more than 3 times a week and we will plan to add back in symbicort twice daily.

## 2021-10-31 NOTE — Assessment & Plan Note (Signed)
He continues to follow with Urology and reports negative MRI prostate and 2 separate negative prostate biopsies.  Urinary symptoms are improved on flomax rx'd by urology.

## 2021-10-31 NOTE — Assessment & Plan Note (Signed)
Stable with OTC pepid AC.

## 2021-10-31 NOTE — Progress Notes (Signed)
Subjective:   By signing my name below, I, Lyric Barr-McArthur, attest that this documentation has been prepared under the direction and in the presence of Debbrah Alar, NP, 10/31/2021   Patient ID: Thomas Valencia, male    DOB: Jul 25, 1961, 60 y.o.   MRN: [id number]  Chief Complaint  Patient presents with   Annual Exam    HPI Patient is in today for a comprehensive physical exam.  Prostate: He was working with another provider who worked with him and taken his PSA which was recorded at a 10.0. He had his prostate biopsied and they did not find anything abnormal. He was prescribed Flomax to help with his symptoms.  Thyroid: He mentions improvement with his thyroid with use of 50 mcg synthroid.  Asthma: He notes the last time he was seen in the office he was prescribed an albuterol inhaler. He did not end up using this inhaler but has been using the Symbicort inhaler 3-4 times a week. He is wondering if he needs to take the Symbicort on a consistent basis or if he can just use it as needed. He notes he find relief with using the Symbicort. He notes that his difficulty with breathing is heavily correlated with his flare in allergies.  Eczema: He notes that he has eczema flare up on his right anterior hip. He sees a dermatologist for this and they have prescribed him a cream to help with it.  Medications: He is compliant in taking 40 mg simvastatin for his high cholesterol. He has been taking 20 mg Pepcid as needed when his symptoms flare up or before he eats something that he knows will aggravate his acid reflux.  He denies any fever, unexpected weight change, adenopathy, rash, hearing loss, ear pain, rhinorrhea, visual disturbances, eye pain, chest pain, leg swelling, cough, nausea, vomitting, diarrhea, blood in stool, dysuria, frequency, myalgias, arthralgias, headaches, depression or anxiety.  Immunizations: He has received his flu shot and his updated Covid-19 booster at this time. His  tetanus is UTD.  Diet: He notes he maintains a healthy diet.  Exercise: He participates in regular exercise. He mentions that he enjoys walking. Colonoscopy: Last performed on 09/29/2018 and results found one 4 mm polyp in the transverse colon which was removed. Repeat after 5 years.  PSA: Last checked on 12/26/2020 and results showed his PSA level at 6.79.  Vision: He is UTD on vision care.  Dental: He is UTD on dental care.   Health Maintenance Due  Topic Date Due   HIV Screening  Never done   COVID-19 Vaccine (4 - Booster for [employer] series) 10/30/2021    Past Medical History:  Diagnosis Date   Allergy    Asthma 10/31/2021   Hearing loss    Hypercholesteremia    Hypothyroidism 10/07/2016   Rhinitis    Tinnitus, right    Vestibular schwannoma ([place]) 12/24/12    Past Surgical History:  Procedure Laterality Date   COLONOSCOPY  08/31/2013   NO PAST SURGERIES     denies surgical history   POLYPECTOMY     WISDOM TOOTH EXTRACTION      Family History  Problem Relation Age of Onset   Hyperlipidemia Mother    Hypertension Mother    Hyperlipidemia Father    Hyperlipidemia Brother    Other Neg Hx        colon ca, prostate ca   Colon cancer Neg Hx    Colon polyps Neg Hx    Esophageal cancer Neg  Hx    Stomach cancer Neg Hx    Rectal cancer Neg Hx     Social History   Socioeconomic History   Marital status: Married    Spouse name: Not on file   Number of children: Not on file   Years of education: Not on file   Highest education level: Not on file  Occupational History   Not on file  Tobacco Use   Smoking status: Never   Smokeless tobacco: Never  Vaping Use   Vaping Use: Never used  Substance and Sexual Activity   Alcohol use: Yes    Alcohol/week: 6.0 standard drinks    Types: 6 Standard drinks or equivalent per week   Drug use: No   Sexual activity: Not on file  Other Topics Concern   Not on file  Social History Narrative   Occupation: Airline pilot ( makes  corporate commercials, Investment banker, corporate )   Married with one son - 86 yrs old    married   Alcohol use-yes   Never Smoked   Social Determinants of Health   Financial Resource Strain: Not on file  Food Insecurity: Not on file  Transportation Needs: Not on file  Physical Activity: Not on file  Stress: Not on file  Social Connections: Not on file  Intimate Partner Violence: Not on file    Outpatient Medications Prior to Visit  Medication Sig Dispense Refill   fluticasone (FLONASE) 50 MCG/ACT nasal spray Place 2 sprays into both nostrils daily. 16 g 6   JUBLIA 10 % SOLN Apply 10 application topically.  2   levothyroxine (SYNTHROID) 50 MCG tablet TAKE 1 TABLET(50 MCG) BY MOUTH DAILY BEFORE BREAKFAST 30 tablet 0   simvastatin (ZOCOR) 40 MG tablet TAKE 1 TABLET(40 MG) BY MOUTH DAILY 90 tablet 1   albuterol (VENTOLIN HFA) 108 (90 Base) MCG/ACT inhaler Inhale 2 puffs into the lungs every 6 (six) hours as needed for wheezing or shortness of breath. 18 g 5   budesonide-formoterol (SYMBICORT) 160-4.5 MCG/ACT inhaler Inhale 2 puffs into the lungs 2 (two) times daily. 1 each 0   cetirizine (ZYRTEC) 10 MG tablet Take 10 mg by mouth daily as needed for allergies.     famotidine (PEPCID) 20 MG tablet Take 1 tablet (20 mg total) by mouth 2 (two) times daily. 60 tablet 0   tobramycin (TOBREX) 0.3 % ophthalmic solution Place 2 drops into the right eye every 6 (six) hours. 5 mL 0   Facility-Administered Medications Prior to Visit  Medication Dose Route Frequency Provider Last Rate Last Admin   0.9 %  sodium chloride infusion  500 mL Intravenous Once Pyrtle, Lajuan Lines, MD        Allergies  Allergen Reactions   Dust Mite Extract     rhinitis   Molds & Smuts     rhinitis    Review of Systems  Constitutional:  Negative for fever.       (-) unexpected weight changes (-) adenopathy  HENT:  Negative for ear pain and hearing loss.        (-) rhinorrhea  Eyes:  Negative for pain.       (-) visual  disturbances   Respiratory:  Negative for cough.   Cardiovascular:  Negative for chest pain and leg swelling.  Gastrointestinal:  Negative for blood in stool, diarrhea, nausea and vomiting.  Genitourinary:  Positive for frequency (improved with use of flomax) and urgency (improved with use of flomax). Negative for dysuria.  Musculoskeletal:  Negative for joint pain and myalgias.  Skin:  Negative for rash.  Neurological:  Negative for headaches.  Psychiatric/Behavioral:  Negative for depression. The patient is not nervous/anxious.       Objective:    Physical Exam Constitutional:      General: He is not in acute distress.    Appearance: Normal appearance. He is not ill-appearing.  HENT:     Head: Normocephalic and atraumatic.     Right Ear: Tympanic membrane, ear canal and external ear normal.     Left Ear: Tympanic membrane, ear canal and external ear normal.  Eyes:     Extraocular Movements: Extraocular movements intact.     Pupils: Pupils are equal, round, and reactive to light.     Comments: (-) nystagmus  Cardiovascular:     Rate and Rhythm: Normal rate and regular rhythm.     Heart sounds: Normal heart sounds. No murmur heard.   No gallop.  Pulmonary:     Effort: Pulmonary effort is normal. No respiratory distress.     Breath sounds: Normal breath sounds. No wheezing or rales.  Abdominal:     General: Bowel sounds are normal.     Palpations: Abdomen is soft.     Tenderness: There is no abdominal tenderness.  Musculoskeletal:     Comments: (+) 5/5 upper and lower extremity strength  Lymphadenopathy:     Cervical: No cervical adenopathy.  Skin:    General: Skin is warm and dry.  Neurological:     Mental Status: He is alert and oriented to person, place, and time.     Deep Tendon Reflexes:     Reflex Scores:      Patellar reflexes are 2+ on the right side and 2+ on the left side. Psychiatric:        Behavior: Behavior normal.        Judgment: Judgment normal.     BP 126/88 (BP Location: Right Arm, Patient Position: Sitting, Cuff Size: Small)   Pulse 81   Temp 98.6 F (37 C) (Oral)   Resp 16   Ht _0  (1.753 m)   Wt 157 lb (71.2 kg)   SpO2 98%   BMI 23.18 kg/m  Wt Readings from Last 3 Encounters:  10/31/21 157 lb (71.2 kg)  05/30/21 158 lb (71.7 kg)  10/17/20 158 lb 6.4 oz (71.8 kg)       Assessment & Plan:   Problem List Items Addressed This Visit       Unprioritized   Routine general medical examination at a health care facility    Continue healthy diet, walking. PSA being followed by Urology. Colo and immunizations reviewed and up to date.       Hypothyroidism    Clinically stable on synthroid, continue same. Obtain follow up tsh.       Relevant Orders   TSH   HYPERCHOLESTEROLEMIA - Primary   Relevant Orders   Comp Met (CMET)   Lipid panel   GERD (gastroesophageal reflux disease)    Stable with OTC pepid AC.       Relevant Medications   famotidine (PEPCID AC) 10 MG tablet   Elevated PSA    He continues to follow with Urology and reports negative MRI prostate and 2 separate negative prostate biopsies.  Urinary symptoms are improved on flomax rx'd by urology.       Asthma    Stable. Not using symbicort regularly. Pt is advised as follows:   Stop symbicort. Continue  albuterol (ventolin) every 6 hours as needed for shortness of breath or wheezing as needed. Call if you are needing to use the albuterol/ventolin more than 3 times a week and we will plan to add back in symbicort twice daily.      Relevant Medications   albuterol (VENTOLIN HFA) 108 (90 Base) MCG/ACT inhaler   Meds ordered this encounter  Medications   albuterol (VENTOLIN HFA) 108 (90 Base) MCG/ACT inhaler    Sig: Inhale 2 puffs into the lungs every 6 (six) hours as needed for wheezing or shortness of breath.    Dispense:  18 g    Refill:  5    Order Specific Question:   Supervising Provider    Answer:   Penni Homans A [[id number]]   famotidine  (PEPCID AC) 10 MG tablet    Sig: Take 1 tablet (10 mg total) by mouth 2 (two) times daily.    Order Specific Question:   Supervising Provider    Answer:   Penni Homans A [[id number]]   tamsulosin (FLOMAX) 0.4 MG CAPS capsule    Sig: Take 1 capsule (0.4 mg total) by mouth daily.    Dispense:  30 capsule    Refill:  3    Order Specific Question:   Supervising Provider    Answer:   Penni Homans A [[id number]]    I, Debbrah Alar, NP, personally preformed the services described in this documentation.  All medical record entries made by the scribe were at my direction and in my presence.  I have reviewed the chart and discharge instructions (if applicable) and agree that the record reflects my personal performance and is accurate and complete. 10/31/2021  I,Lyric Barr-McArthur,acting as a Education administrator for Nance Pear, NP.,have documented all relevant documentation on the behalf of Nance Pear, NP,as directed by  Nance Pear, NP while in the presence of Nance Pear, NP.  Nance Pear, NP

## 2021-10-31 NOTE — Assessment & Plan Note (Signed)
Clinically stable on synthroid, continue same. Obtain follow up tsh.

## 2021-10-31 NOTE — Patient Instructions (Signed)
Stop symbicort. Continue albuterol (ventolin) every 6 hours as needed for shortness of breath or wheezing as needed. Call if you are needing to use the albuterol/ventolin more than 3 times a week and we will plan to add back in symbicort twice daily.

## 2021-10-31 NOTE — Assessment & Plan Note (Addendum)
Continue healthy diet, walking. PSA being followed by Urology. Colo and immunizations reviewed and up to date.

## 2021-11-01 ENCOUNTER — Encounter: Payer: Self-pay | Admitting: Family

## 2021-11-15 ENCOUNTER — Other Ambulatory Visit: Payer: Self-pay | Admitting: Family

## 2021-12-10 ENCOUNTER — Other Ambulatory Visit: Payer: Self-pay | Admitting: Family

## 2021-12-12 ENCOUNTER — Other Ambulatory Visit: Payer: Self-pay

## 2021-12-12 ENCOUNTER — Encounter: Payer: Self-pay | Admitting: Family

## 2021-12-12 MED ORDER — SIMVASTATIN 40 MG PO TABS: ORAL_TABLET | ORAL | 1 refills | Status: DC

## 2021-12-12 NOTE — Telephone Encounter (Signed)
Called pharmacy and patient was requesting simbicort instead of albuterol. Pharmacist advised to get both ready for him. Patient advised to pick up today.

## 2021-12-15 ENCOUNTER — Encounter: Payer: Self-pay | Admitting: Family

## 2021-12-15 DIAGNOSIS — J45909 Unspecified asthma, uncomplicated: Secondary | ICD-10-CM

## 2021-12-19 ENCOUNTER — Ambulatory Visit: Payer: BLUE CROSS/BLUE SHIELD | Admitting: Medical

## 2021-12-19 ENCOUNTER — Encounter: Payer: Self-pay | Admitting: Internal Medicine

## 2021-12-21 ENCOUNTER — Other Ambulatory Visit: Payer: Self-pay

## 2021-12-21 ENCOUNTER — Ambulatory Visit (HOSPITAL_BASED_OUTPATIENT_CLINIC_OR_DEPARTMENT_OTHER)
Admission: RE | Admit: 2021-12-21 | Discharge: 2021-12-21 | Disposition: A | Discharge status: Home / Self Care | Payer: BLUE CROSS/BLUE SHIELD | Source: Ambulatory Visit | Attending: Internal Medicine | Admitting: Internal Medicine

## 2021-12-21 ENCOUNTER — Encounter: Payer: Self-pay | Admitting: Internal Medicine

## 2021-12-21 ENCOUNTER — Ambulatory Visit: Payer: BLUE CROSS/BLUE SHIELD | Admitting: Internal Medicine

## 2021-12-21 ENCOUNTER — Encounter (INDEPENDENT_AMBULATORY_CARE_PROVIDER_SITE_OTHER): Payer: Self-pay

## 2021-12-21 VITALS — BP 126/78 | HR 107 | Temp 97.9°F | Resp 16 | Ht 69.0 in | Wt 162.0 lb

## 2021-12-21 DIAGNOSIS — J4541 Moderate persistent asthma with (acute) exacerbation: Secondary | ICD-10-CM

## 2021-12-21 MED ORDER — AZITHROMYCIN 250 MG PO TABS: ORAL_TABLET | ORAL | 0 refills | Status: DC

## 2021-12-21 MED ORDER — PREDNISONE 10 MG PO TABS: ORAL_TABLET | ORAL | 0 refills | Status: DC

## 2021-12-21 MED ORDER — BUDESONIDE-FORMOTEROL FUMARATE 160-4.5 MCG/ACT IN AERO: 2.0000 | INHALATION_SPRAY | Freq: Two times a day (BID) | RESPIRATORY_TRACT | 1 refills | Status: DC

## 2021-12-21 MED ORDER — PANTOPRAZOLE SODIUM 40 MG PO TBEC: 40.0000 mg | DELAYED_RELEASE_TABLET | Freq: Every day | ORAL | 3 refills | Status: DC

## 2021-12-21 NOTE — Patient Instructions (Signed)
Keep the appointment with the pulmonologist  Go to the first floor and obtain x-ray  Symbicort 2 puffs twice a day every day  Albuterol use your rescue inhaler, 2 puffs every 6 hours if cough increasing   Prednisone for few days, follow the instructions on the bottle  Zithromax for 5 days, this is an antibiotic  Start pantoprazole and acid reducer: 1 tablet before breakfast.  Also okay to take Mucinex as needed for phlegm

## 2021-12-21 NOTE — Progress Notes (Signed)
Subjective:    Patient ID: Thomas Valencia, male    DOB: 10-Jun-1961, 60 y.o.   MRN: [id number]  DOS:  12/21/2021 Type of visit - description: acute Symptoms started ~ June 2022 with on and off wheezing and cough. At that time a chest x-ray was negative, was prescribed Symbicort and famotidine.  Onset of symptoms was not preceded with a URI or respiratory infection. Since then, he continues w/ on and off wheezing and cough with occasional sputum production, green in color. Symptoms immediately disappear with either Symbicort or albuterol.  Was seen 10/31/2021, at the time he was doing well, w3as  rec to stop Symbicort and use albuterol as needed Subsequently symptoms increases, he was recommended to go back on Symbicort, was referred to pulmonology 12/15/2021  He is here today asking for further advice prior to be seen by pulmonary.   He is a non-smoker. No fever chills GERD not completely well controlled, only taking Pepcid every other day.  Reports acid reflux at least 2-3 times a week. No chest pain, no shortness of breath. No DOE, he actually is able to walk regularly, did 5 miles yesterday.    Review of Systems See above   Past Medical History:  Diagnosis Date   Allergy    Asthma 10/31/2021   Hearing loss    Hypercholesteremia    Hypothyroidism 10/07/2016   Rhinitis    Tinnitus, right    Vestibular schwannoma ([place]) 12/24/12    Past Surgical History:  Procedure Laterality Date   COLONOSCOPY  08/31/2013   NO PAST SURGERIES     denies surgical history   POLYPECTOMY     WISDOM TOOTH EXTRACTION      Allergies as of 12/21/2021       Reactions   Dust Mite Extract    rhinitis   Molds & Smuts    rhinitis        Medication List        Accurate as of December 21, 2021  8:07 AM. If you have any questions, ask your nurse or doctor.          albuterol 108 (90 Base) MCG/ACT inhaler Commonly known as: VENTOLIN HFA Inhale 2 puffs into the lungs every 6 (six)  hours as needed for wheezing or shortness of breath.   famotidine 10 MG tablet Commonly known as: Pepcid AC Take 1 tablet (10 mg total) by mouth 2 (two) times daily.   fluticasone 50 MCG/ACT nasal spray Commonly known as: FLONASE Place 2 sprays into both nostrils daily.   Jublia 10 % Soln Generic drug: Efinaconazole Apply 10 application topically.   levothyroxine 50 MCG tablet Commonly known as: SYNTHROID TAKE 1 TABLET(50 MCG) BY MOUTH DAILY BEFORE BREAKFAST   simvastatin 40 MG tablet Commonly known as: ZOCOR TAKE 1 TABLET(40 MG) BY MOUTH DAILY   tamsulosin 0.4 MG Caps capsule Commonly known as: FLOMAX Take 1 capsule (0.4 mg total) by mouth daily.           Objective:   Physical Exam BP 126/78 (BP Location: Left Arm, Patient Position: Sitting, Cuff Size: Small)    Pulse (!) 107    Temp 97.9 F (36.6 C) (Oral)    Resp 16    Ht 5\' 9"  (1.753 m)    Wt 162 lb (73.5 kg)    SpO2 95%    BMI 23.92 kg/m  General:   Well developed, NAD, BMI noted. HEENT:  Normocephalic . Face symmetric, atraumatic. TMs: Normal.  Nose:  Slightly congested. Throat: No redness, no white patches. Lungs:  minimal rhonchi, slightly increased expiratory time   Normal respiratory effort, no intercostal retractions, no accessory muscle use. Heart: RRR,  no murmur.  Lower extremities: no pretibial edema bilaterally  Skin: Not pale. Not jaundice Neurologic:  alert & oriented X3.  Speech normal, gait appropriate for age and unassisted Psych--  Cognition and judgment appear intact.  Cooperative with normal attention span and concentration.  Behavior appropriate. No anxious or depressed appearing.      Assessment     60 year old gentleman, PMH include thyroid disease, acid reflux, high cholesterol, presents with:  Asthma: On and off wheezing for several months, symptoms were not preceded by URI, GERD is not 100% controlled, he does have some sputum production. Patient concerned about the  diagnosis, it is indeed somewhat uncommon to develop de novo asthma at his age but not impossible.  Plan: Obtain another x-ray Symbicort twice daily every day, albuterol every 6 hours as needed only. Round of prednisone. Zithromax (atypical bronchitis?) Optimize treatment for acid reflux, start pantoprazole. Keep referral to see pulmonology. All of the above carefully discussed with the patient who verbalized understanding  This visit occurred during the SARS-CoV-2 public health emergency.  Safety protocols were in place, including screening questions prior to the visit, additional usage of staff PPE, and extensive cleaning of exam room while observing appropriate contact time as indicated for disinfecting solutions.

## 2021-12-26 ENCOUNTER — Encounter: Payer: Self-pay | Admitting: Internal Medicine

## 2021-12-26 DIAGNOSIS — J4541 Moderate persistent asthma with (acute) exacerbation: Secondary | ICD-10-CM

## 2022-01-05 ENCOUNTER — Encounter: Payer: Self-pay | Admitting: Family

## 2022-01-23 ENCOUNTER — Institutional Professional Consult (permissible substitution): Payer: BLUE CROSS/BLUE SHIELD | Admitting: Student

## 2022-02-07 ENCOUNTER — Institutional Professional Consult (permissible substitution): Payer: Self-pay | Admitting: Student

## 2022-04-15 ENCOUNTER — Encounter: Payer: Self-pay | Admitting: Family

## 2022-04-16 ENCOUNTER — Other Ambulatory Visit: Payer: Self-pay

## 2022-04-16 MED ORDER — SIMVASTATIN 40 MG PO TABS
ORAL_TABLET | ORAL | 1 refills | Status: DC
Start: 2022-04-16 — End: 2022-07-13

## 2022-04-25 ENCOUNTER — Other Ambulatory Visit: Payer: Self-pay | Admitting: Family

## 2022-04-30 ENCOUNTER — Ambulatory Visit: Payer: BLUE CROSS/BLUE SHIELD | Admitting: Family

## 2022-07-11 ENCOUNTER — Other Ambulatory Visit: Payer: Self-pay

## 2022-07-11 MED ORDER — PANTOPRAZOLE SODIUM 40 MG PO TBEC: 40.0000 mg | DELAYED_RELEASE_TABLET | Freq: Every day | ORAL | 1 refills | Status: DC

## 2022-07-13 ENCOUNTER — Other Ambulatory Visit: Payer: Self-pay | Admitting: Family

## 2022-07-16 ENCOUNTER — Encounter: Payer: Self-pay | Admitting: Family

## 2022-07-16 MED ORDER — TAMSULOSIN HCL 0.4 MG PO CAPS: 0.4000 mg | ORAL_CAPSULE | Freq: Every day | ORAL | 3 refills | Status: DC

## 2022-09-18 ENCOUNTER — Encounter: Payer: Self-pay | Admitting: Family

## 2022-09-18 MED ORDER — PANTOPRAZOLE SODIUM 40 MG PO TBEC: 40.0000 mg | DELAYED_RELEASE_TABLET | Freq: Every day | ORAL | 1 refills | Status: DC

## 2022-11-02 ENCOUNTER — Encounter: Payer: Self-pay | Admitting: Family

## 2022-11-20 ENCOUNTER — Encounter: Payer: Self-pay | Admitting: Family

## 2022-11-20 ENCOUNTER — Ambulatory Visit (INDEPENDENT_AMBULATORY_CARE_PROVIDER_SITE_OTHER): Payer: 59 | Admitting: Family

## 2022-11-20 VITALS — BP 127/75 | HR 76 | Temp 97.7°F | Resp 16 | Ht 69.0 in | Wt 156.0 lb

## 2022-11-20 DIAGNOSIS — K219 Gastro-esophageal reflux disease without esophagitis: Secondary | ICD-10-CM | POA: Diagnosis not present

## 2022-11-20 DIAGNOSIS — N401 Enlarged prostate with lower urinary tract symptoms: Secondary | ICD-10-CM | POA: Diagnosis not present

## 2022-11-20 DIAGNOSIS — Z23 Encounter for immunization: Secondary | ICD-10-CM | POA: Diagnosis not present

## 2022-11-20 DIAGNOSIS — J309 Allergic rhinitis, unspecified: Secondary | ICD-10-CM

## 2022-11-20 DIAGNOSIS — Z0000 Encounter for general adult medical examination without abnormal findings: Secondary | ICD-10-CM | POA: Diagnosis not present

## 2022-11-20 DIAGNOSIS — J4541 Moderate persistent asthma with (acute) exacerbation: Secondary | ICD-10-CM

## 2022-11-20 DIAGNOSIS — E039 Hypothyroidism, unspecified: Secondary | ICD-10-CM

## 2022-11-20 DIAGNOSIS — R972 Elevated prostate specific antigen [PSA]: Secondary | ICD-10-CM

## 2022-11-20 LAB — CBC WITH DIFFERENTIAL/PLATELET
Basophils Absolute: 0.1 10*3/uL (ref 0.0–0.1)
Basophils Relative: 1 % (ref 0.0–3.0)
Eosinophils Absolute: 0.2 10*3/uL (ref 0.0–0.7)
Eosinophils Relative: 3.6 % (ref 0.0–5.0)
HCT: 42.6 % (ref 39.0–52.0)
Hemoglobin: 14.3 g/dL (ref 13.0–17.0)
Lymphocytes Relative: 19.7 % (ref 12.0–46.0)
Lymphs Abs: 1.2 10*3/uL (ref 0.7–4.0)
MCHC: 33.6 g/dL (ref 30.0–36.0)
MCV: 88.2 fl (ref 78.0–100.0)
Monocytes Absolute: 0.5 10*3/uL (ref 0.1–1.0)
Monocytes Relative: 7.3 % (ref 3.0–12.0)
Neutro Abs: 4.2 10*3/uL (ref 1.4–7.7)
Neutrophils Relative %: 68.4 % (ref 43.0–77.0)
Platelets: 223 10*3/uL (ref 150.0–400.0)
RBC: 4.83 Mil/uL (ref 4.22–5.81)
RDW: 13.5 % (ref 11.5–15.5)
WBC: 6.2 10*3/uL (ref 4.0–10.5)

## 2022-11-20 LAB — COMPREHENSIVE METABOLIC PANEL
ALT: 19 U/L (ref 0–53)
AST: 18 U/L (ref 0–37)
Albumin: 4.6 g/dL (ref 3.5–5.2)
Alkaline Phosphatase: 57 U/L (ref 39–117)
BUN: 21 mg/dL (ref 6–23)
CO2: 31 mEq/L (ref 19–32)
Calcium: 9.4 mg/dL (ref 8.4–10.5)
Chloride: 104 mEq/L (ref 96–112)
Creatinine, Ser: 0.94 mg/dL (ref 0.40–1.50)
GFR: 88.16 mL/min (ref >60.00)
Glucose, Bld: 97 mg/dL (ref 70–99)
Potassium: 4.3 mEq/L (ref 3.5–5.1)
Sodium: 143 mEq/L (ref 135–145)
Total Bilirubin: 0.8 mg/dL (ref 0.2–1.2)
Total Protein: 7 g/dL (ref 6.0–8.3)

## 2022-11-20 LAB — LIPID PANEL
Cholesterol: 193 mg/dL (ref 0–200)
HDL: 47.3 mg/dL (ref >39.00)
LDL Cholesterol: 123 mg/dL — ABNORMAL HIGH (ref 0–99)
NonHDL: 146.06
Total CHOL/HDL Ratio: 4
Triglycerides: 116 mg/dL (ref 0.0–149.0)
VLDL: 23.2 mg/dL (ref 0.0–40.0)

## 2022-11-20 LAB — TSH: TSH: 2.38 u[IU]/mL (ref 0.35–5.50)

## 2022-11-20 MED ORDER — OMEPRAZOLE 20 MG PO CPDR: 20.0000 mg | DELAYED_RELEASE_CAPSULE | Freq: Every day | ORAL | 3 refills | Status: DC | PRN

## 2022-11-20 NOTE — Progress Notes (Signed)
Subjective:   By signing my name below, I, Carylon Perches, attest that this documentation has been prepared under the direction and in the presence of Tyronza, NP 11/20/2022   Patient ID: Thomas Valencia, male    DOB: 13-Apr-1961, 61 y.o.   MRN: [id number]  Chief Complaint  Patient presents with   Annual Exam    HPI Patient is in today for a comprehensive physical exam  Asthma: He states that he had previous asthma symptoms but not asthma itself. He has previously went to a pulmonologist and was diagnosed with questionable asthma.   Nasal Congestion: He takes sinumist daily. He also alternates between Zyrtec, Claritin and Allegra. He states that he is consistently congested.   Reflux: He states that he takes Pepcid and Protonix about 3-4 times a year. He notes that his trigger is alcohol.  Thyroid: He is currently taking 50 mcg of Synthroid.  Lab Results  Component Value Date   TSH 1.54 10/31/2021   Urination: He is currently taking 0.4 mg of Flomax daily and reports that he is responding well to the medication. He gets about about once nightly.   PSA: He states that his PSA levels fluctuate. He is currently following up with his urologist.  Lab Results  Component Value Date   PSA 6.79 12/26/2020   PSA 3.64 10/16/2019   PSA 5.85 (H) 10/10/2018   He denies having any fever, hearing or vision symptoms, new muscle pain, joint pain , new moles, rashes, congestion, sinus pain, sore throat, palpations, cough, SOB ,wheezing,n/v/d constipation, blood in stool, dysuria, frequency, hematuria, depression, anxiety, headaches at this time  Social history: He reports no recent surgeries. He occasionally consumes cannabis-infused foods.  Colonoscopy: Last completed on 09/29/2018 PSA: Last completed on 12/26/2020 on file. He reports that his last PSA was a few months ago.  Immunizations: He is interested in receiving a tetanus vaccine during today's visit. He is UTD on the  influenza and Covid vaccine.  Diet: He is maintaining a healthy diet.  Exercise: He is regularly exercising.  Dental: He is UTD on dental exams  Vision: He is UTD on vision exams   Health Maintenance Due  Topic Date Due   HIV Screening  Never done   COVID-19 Vaccine (5 - 2023-24 season) 11/08/2022    Past Medical History:  Diagnosis Date   Allergy    Asthma 10/31/2021   Hearing loss    Hypercholesteremia    Hypothyroidism 10/07/2016   Rhinitis    Tinnitus, right    Vestibular schwannoma ([place]) 12/24/12    Past Surgical History:  Procedure Laterality Date   COLONOSCOPY  08/31/2013   NO PAST SURGERIES     denies surgical history   POLYPECTOMY     WISDOM TOOTH EXTRACTION      Family History  Problem Relation Age of Onset   Hyperlipidemia Mother    Hypertension Mother    Hyperlipidemia Father    Hyperlipidemia Brother    Other Neg Hx        colon ca, prostate ca   Colon cancer Neg Hx    Colon polyps Neg Hx    Esophageal cancer Neg Hx    Stomach cancer Neg Hx    Rectal cancer Neg Hx     Social History   Socioeconomic History   Marital status: Married    Spouse name: Not on file   Number of children: Not on file   Years of education: Not on file  Highest education level: Not on file  Occupational History   Not on file  Tobacco Use   Smoking status: Never   Smokeless tobacco: Never  Vaping Use   Vaping Use: Never used  Substance and Sexual Activity   Alcohol use: Yes    Alcohol/week: 6.0 standard drinks of alcohol    Types: 6 Standard drinks or equivalent per week    Comment: or less   Drug use: Yes    Types: Marijuana    Comment: occasional THC Gummies   Sexual activity: Yes    Partners: Female  Other Topics Concern   Not on file  Social History Narrative   Occupation: Airline pilot ( makes corporate commercials, Investment banker, [employer] )   Married with one son - lives in [place]   married   Alcohol use-yes   Never Smoked   Social Determinants of Health    Financial Resource Strain: Not on file  Food Insecurity: Not on file  Transportation Needs: Not on file  Physical Activity: Not on file  Stress: Not on file  Social Connections: Not on file  Intimate Partner Violence: Not on file    Outpatient Medications Prior to Visit  Medication Sig Dispense Refill   cetirizine (ZYRTEC ALLERGY) 10 MG tablet 1 tablet Orally Once a day     fexofenadine (ALLEGRA ODT) 30 MG disintegrating tablet Take 30 mg by mouth daily.     fluticasone (FLONASE) 50 MCG/ACT nasal spray Place 2 sprays into both nostrils daily. 16 g 6   levothyroxine (SYNTHROID) 50 MCG tablet TAKE 1 TABLET BY MOUTH EVERY DAY BEFORE BREAKFAST 90 tablet 1   simvastatin (ZOCOR) 40 MG tablet TAKE 1 TABLET BY MOUTH EVERY DAY 90 tablet 1   tamsulosin (FLOMAX) 0.4 MG CAPS capsule Take 1 capsule (0.4 mg total) by mouth daily. 30 capsule 3   albuterol (VENTOLIN HFA) 108 (90 Base) MCG/ACT inhaler Inhale 2 puffs into the lungs every 6 (six) hours as needed for wheezing or shortness of breath. 18 g 5   azithromycin (ZITHROMAX Z-PAK) 250 MG tablet 2 tabs a day the first day, then 1 tab a day x 4 days 6 tablet 0   budesonide-formoterol (SYMBICORT) 160-4.5 MCG/ACT inhaler Inhale 2 puffs into the lungs 2 (two) times daily. 1 each 1   famotidine (PEPCID AC) 10 MG tablet Take 1 tablet (10 mg total) by mouth 2 (two) times daily.     pantoprazole (PROTONIX) 40 MG tablet Take 1 tablet (40 mg total) by mouth daily. 90 tablet 1   predniSONE (DELTASONE) 10 MG tablet 4 tablets x 2 days, 3 tabs x 2 days, 2 tabs x 2 days, 1 tab x 2 days 20 tablet 0   No facility-administered medications prior to visit.    Allergies  Allergen Reactions   Dust Mite Extract     rhinitis   Molds & Smuts     rhinitis    Review of Systems  Constitutional:  Negative for fever.  HENT:  Negative for congestion, sinus pain and sore throat.   Respiratory:  Negative for cough, shortness of breath and wheezing.   Cardiovascular:   Negative for palpitations.  Gastrointestinal:  Negative for blood in stool, constipation, diarrhea, nausea and vomiting.  Genitourinary:  Negative for dysuria, frequency and hematuria.  Musculoskeletal:  Negative for joint pain and myalgias.  Skin:  Negative for rash.       (-) New Moles  Neurological:  Negative for headaches.  Psychiatric/Behavioral:  Negative for depression. The  patient is not nervous/anxious.        Objective:    Physical Exam Constitutional:      General: He is not in acute distress.    Appearance: Normal appearance. He is not ill-appearing.  HENT:     Head: Normocephalic and atraumatic.     Right Ear: Tympanic membrane, ear canal and external ear normal.     Left Ear: Tympanic membrane, ear canal and external ear normal.  Eyes:     Extraocular Movements: Extraocular movements intact.     Pupils: Pupils are equal, round, and reactive to light.  Cardiovascular:     Rate and Rhythm: Normal rate and regular rhythm.     Heart sounds: Normal heart sounds. No murmur heard.    No gallop.  Pulmonary:     Effort: Pulmonary effort is normal. No respiratory distress.     Breath sounds: Normal breath sounds. No wheezing or rales.  Abdominal:     General: Bowel sounds are normal. There is no distension.     Palpations: Abdomen is soft.     Tenderness: There is no abdominal tenderness. There is no guarding.  Musculoskeletal:     Comments: 5/5 strength in both upper and lower extremities    Skin:    General: Skin is warm and dry.  Neurological:     Mental Status: He is alert and oriented to person, place, and time.     Deep Tendon Reflexes:     Reflex Scores:      Patellar reflexes are 2+ on the right side and 2+ on the left side. Psychiatric:        Mood and Affect: Mood normal.        Behavior: Behavior normal.        Judgment: Judgment normal.     BP 127/75 (BP Location: Right Arm, Patient Position: Sitting, Cuff Size: Small)   Pulse 76   Temp 97.7 F  (36.5 C) (Oral)   Resp 16   Ht _0  (1.753 m)   Wt 156 lb (70.8 kg)   SpO2 99%   BMI 23.04 kg/m  Wt Readings from Last 3 Encounters:  11/20/22 156 lb (70.8 kg)  12/21/21 162 lb (73.5 kg)  10/31/21 157 lb (71.2 kg)       Assessment & Plan:   Problem List Items Addressed This Visit       Unprioritized   Routine general medical examination at a health care facility    Continue healthy diet, exercise. Td today.  Colo next year.  Vision/dental up to date.       Hypothyroidism    Continues synthroid 50 mcg once daily clinically stable.        Relevant Orders   TSH   GERD (gastroesophageal reflux disease)    Uses pepcid/pantoprazole only rarely prn. Will d/c both and switch to otc omeprazole 89m po prn.      Relevant Medications   omeprazole (PRILOSEC) 20 MG capsule   Elevated PSA    This is being monitored by Urology.       Benign prostatic hyperplasia with lower urinary tract symptoms    Urinary symptoms stable on flomax. Continue same.       RESOLVED: Asthma - Primary    Pt reports that he has no symptoms currently.       Allergic rhinitis    Using otc sensimist. Stable overall. He also takes either claritin, zyrtec once daily.  Other Visit Diagnoses     Preventative health care       Relevant Orders   Comp Met (CMET)   Lipid panel   CBC with Differential/Platelet      Meds ordered this encounter  Medications   omeprazole (PRILOSEC) 20 MG capsule    Sig: Take 1 capsule (20 mg total) by mouth daily as needed.    Dispense:  30 capsule    Refill:  3    Order Specific Question:   Supervising Provider    Answer:   Penni Homans A [[id number]]    I, Nance Pear, NP, personally preformed the services described in this documentation.  All medical record entries made by the scribe were at my direction and in my presence.  I have reviewed the chart and discharge instructions (if applicable) and agree that the record reflects my personal  performance and is accurate and complete. 11/20/2022   I,Amber Collins,acting as a scribe for Nance Pear, NP.,have documented all relevant documentation on the behalf of Nance Pear, NP,as directed by  Nance Pear, NP while in the presence of Nance Pear, NP.    Nance Pear, NP

## 2022-11-20 NOTE — Assessment & Plan Note (Signed)
Continues synthroid 50 mcg once daily clinically stable.

## 2022-11-20 NOTE — Addendum Note (Signed)
Addended by: Jiles Prows on: 11/20/2022 11:15 AM   Modules accepted: Orders

## 2022-11-20 NOTE — Assessment & Plan Note (Signed)
Urinary symptoms stable on flomax. Continue same.

## 2022-11-20 NOTE — Assessment & Plan Note (Signed)
This is being monitored by Urology.

## 2022-11-20 NOTE — Assessment & Plan Note (Signed)
Pt reports that he has no symptoms currently.

## 2022-11-20 NOTE — Assessment & Plan Note (Addendum)
Uses pepcid/pantoprazole only rarely prn. Will d/c both and switch to otc omeprazole '20mg'$  po prn.

## 2022-11-20 NOTE — Assessment & Plan Note (Signed)
Using otc sensimist. Stable overall. He also takes either claritin, zyrtec once daily.

## 2022-11-20 NOTE — Assessment & Plan Note (Signed)
Continue healthy diet, exercise. Td today.  Colo next year.  Vision/dental up to date.

## 2022-11-24 ENCOUNTER — Other Ambulatory Visit: Payer: Self-pay | Admitting: Family

## 2022-11-25 ENCOUNTER — Encounter: Payer: Self-pay | Admitting: Family

## 2022-11-27 MED ORDER — LEVOTHYROXINE SODIUM 50 MCG PO TABS: 50.0000 ug | ORAL_TABLET | Freq: Every day | ORAL | 1 refills | Status: DC

## 2022-11-27 MED ORDER — TAMSULOSIN HCL 0.4 MG PO CAPS: 0.4000 mg | ORAL_CAPSULE | Freq: Every day | ORAL | 1 refills | Status: DC

## 2022-11-27 MED ORDER — SIMVASTATIN 40 MG PO TABS: ORAL_TABLET | ORAL | 1 refills | Status: DC

## 2023-01-01 DIAGNOSIS — L308 Other specified dermatitis: Secondary | ICD-10-CM | POA: Diagnosis not present

## 2023-01-01 DIAGNOSIS — B351 Tinea unguium: Secondary | ICD-10-CM | POA: Diagnosis not present

## 2023-01-01 DIAGNOSIS — L821 Other seborrheic keratosis: Secondary | ICD-10-CM | POA: Diagnosis not present

## 2023-01-01 DIAGNOSIS — L814 Other melanin hyperpigmentation: Secondary | ICD-10-CM | POA: Diagnosis not present

## 2023-01-01 DIAGNOSIS — Z872 Personal history of diseases of the skin and subcutaneous tissue: Secondary | ICD-10-CM | POA: Diagnosis not present

## 2023-01-01 DIAGNOSIS — L718 Other rosacea: Secondary | ICD-10-CM | POA: Diagnosis not present

## 2023-01-01 DIAGNOSIS — D225 Melanocytic nevi of trunk: Secondary | ICD-10-CM | POA: Diagnosis not present

## 2023-01-23 IMAGING — MR MR PROSTATE WO/W CM
12 series · 48 of 48 positions shown · IV contrast (multihance)
Comparison: None.

CLINICAL DATA: Elevated PSA.

EXAM:
MR PROSTATE WITHOUT AND WITH CONTRAST
TECHNIQUE: Multiplanar multisequence MRI images were obtained of the pelvis
centered about the prostate. Pre and post contrast images were
obtained.
CONTRAST:  14mL MULTIHANCE GADOBENATE DIMEGLUMINE 529 MG/ML IV SOLN

[Series 3: T2 · coronal · 3.0mm · 0.56mm/px · 1 of 23 slices shown (1 of 3)]
[im 1/23]
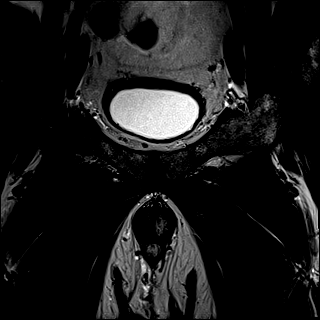

[Series 4: T1 · axial · 5.0mm · 1.25mm/px · 1 of 88 slices shown]
[im 1/88]
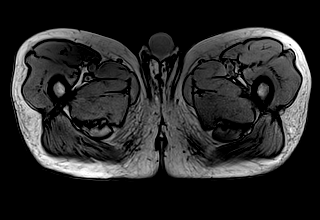

[Series 5: DWI · axial · 3.0mm · 1.75mm/px · 1 of 87 slices shown (1 of 3)]
[im 1/87]
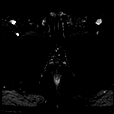

[Series 6: DWI · axial · 3.0mm · 1.75mm/px · 1 of 29 slices shown (2 of 3)]
[im 1/29]
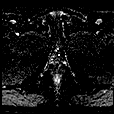

[Series 7: DWI · axial · 3.0mm · 1.75mm/px · 1 of 29 slices shown (3 of 3)]
[im 1/29]
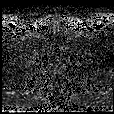

[Series 8: T2 · axial · 3.0mm · 0.56mm/px · 1 of 27 slices shown (2 of 3)]
[im 1/27]
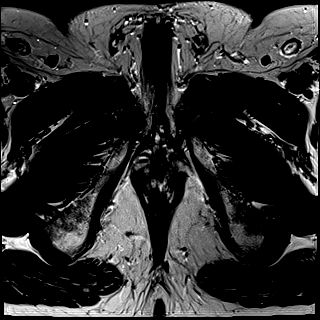

[Series 9: T2 · axial · 1.0mm · 1.04mm/px · z∈[-54,+25]mm · 2 of 80 slices shown (3 of 3)]
[im 1/80]
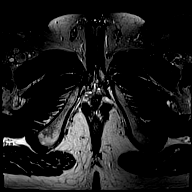
[im 80/80]
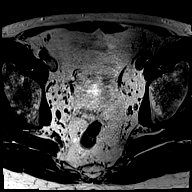

[Series 10: pre t1_twist_tra_dyn · axial · non-contrast · 3.5mm · 0.83mm/px · 1 of 24 slices shown]
[im 1/24]
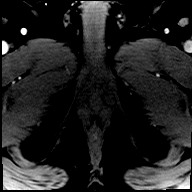

[Series 11: post t1_twist_tra_dyn-copy center · axial · non-contrast · 3.5mm · 0.83mm/px · z∈[-55,+25]mm · 18 of 720 slices shown]
[im 1/720]
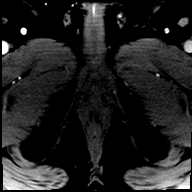
[im 43/720]
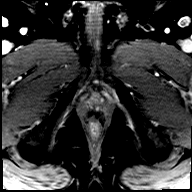
[im 85/720]
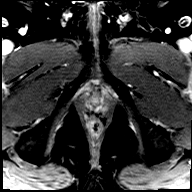
[im 127/720]
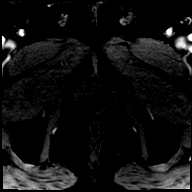
[im 170/720]
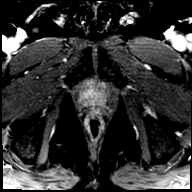
[im 212/720]
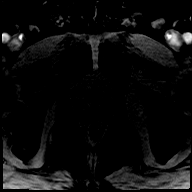
[im 254/720]
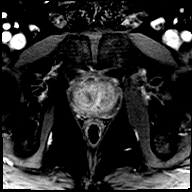
[im 297/720]
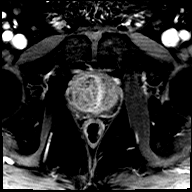
[im 339/720]
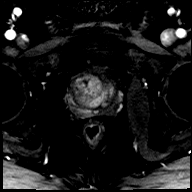
[im 381/720]
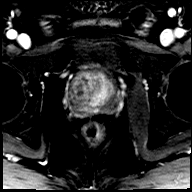
[im 423/720]
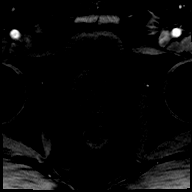
[im 466/720]
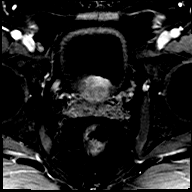
[im 508/720]
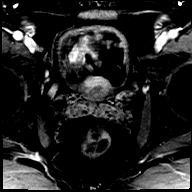
[im 550/720]
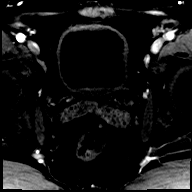
[im 593/720]
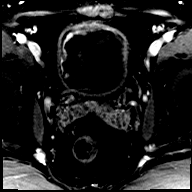
[im 635/720]
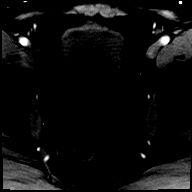
[im 677/720]
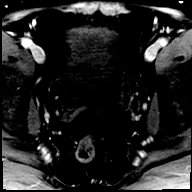
[im 720/720]
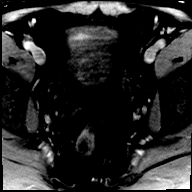

[Series 12: post t1_twist_tra_dyn-copy cent_sub · axial · 3.5mm · 0.83mm/px · z∈[-55,+25]mm · 17 of 694 slices shown]
[im 1/694]
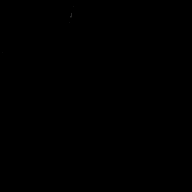
[im 44/694]
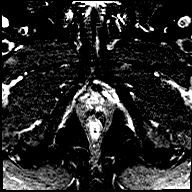
[im 87/694]
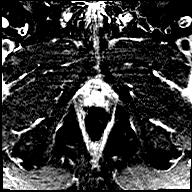
[im 130/694]
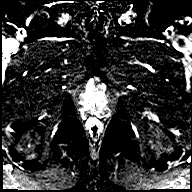
[im 174/694]
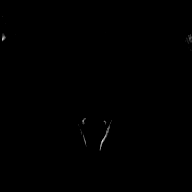
[im 217/694]
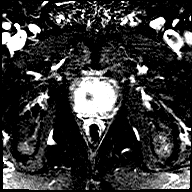
[im 260/694]
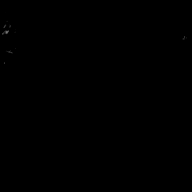
[im 304/694]
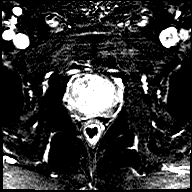
[im 347/694]
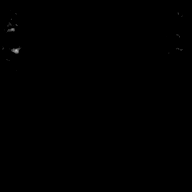
[im 390/694]
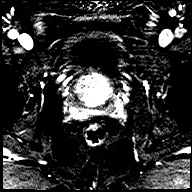
[im 434/694]
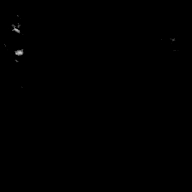
[im 477/694]
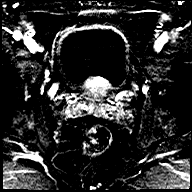
[im 520/694]
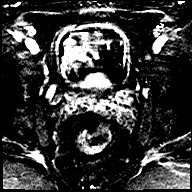
[im 564/694]
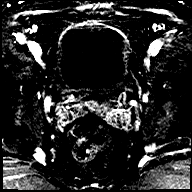
[im 607/694]
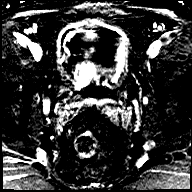
[im 650/694]
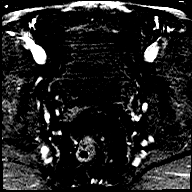
[im 694/694]
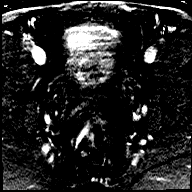

[Series 13: t1_vibe_dixon_tra_f · axial · 2.5mm · 0.91mm/px · z∈[-86,+111]mm · 2 of 80 slices shown]
[im 1/80]
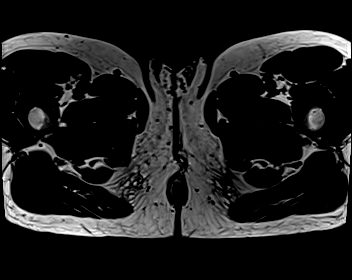
[im 80/80]
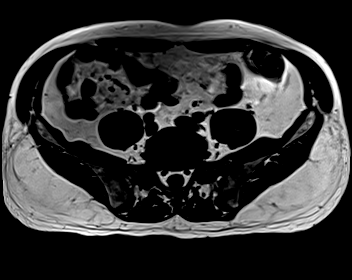

[Series 14: t1_vibe_dixon_tra_w · axial · 2.5mm · 0.91mm/px · z∈[-86,+111]mm · 2 of 80 slices shown]
[im 1/80]
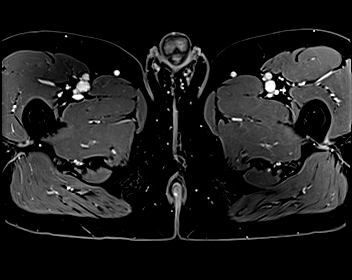
[im 80/80]
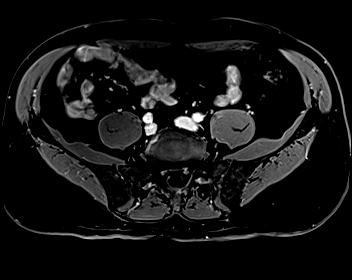

[48 of 48 positions shown; findings below may reference images not displayed]

FINDINGS: Prostate:

-- Peripheral Zone: Linear/wedge shaped hypointensities are noted on
ADC; however, no focal ADC hypointense or high b-value DWI
hyperintense nodules are identified.

-- Transition/Central Zone: Enlarged with circumscribed BPH nodules
noted, but no suspicious nodules with obscured or non-circumscribed
margins seen.

-- Measurements/Volume:  4.9 by 4.0 x 4.6 cm (volume = 47 cm^3)

Transcapsular spread:  Absent

Seminal vesicle involvement:  Absent

Neurovascular bundle involvement:  Absent

Pelvic adenopathy: None visualized

Bone metastasis: None visualized

Other:  None
IMPRESSION: No radiographic evidence of high-grade prostate carcinoma. PI-RADS
2: Low (clinically significant cancer is unlikely to be present)

## 2023-03-10 ENCOUNTER — Other Ambulatory Visit: Payer: Self-pay | Admitting: Family

## 2023-03-19 ENCOUNTER — Encounter: Payer: Self-pay | Admitting: Family

## 2023-03-19 MED ORDER — BUDESONIDE-FORMOTEROL FUMARATE 80-4.5 MCG/ACT IN AERO: 2.0000 | INHALATION_SPRAY | Freq: Two times a day (BID) | RESPIRATORY_TRACT | 3 refills | Status: DC

## 2023-03-19 MED ORDER — ALBUTEROL SULFATE HFA 108 (90 BASE) MCG/ACT IN AERS: 2.0000 | INHALATION_SPRAY | Freq: Four times a day (QID) | RESPIRATORY_TRACT | 1 refills | Status: DC | PRN

## 2023-05-29 IMAGING — DX DG CHEST 2V
2 series · 2 of 2 positions shown · non-contrast
Comparison: Single-view of the chest 02/10/2015.

CLINICAL DATA: Wheezing at night when lying down for 6 or 7 months.

EXAM:
CHEST - 2 VIEW

[chest pa]
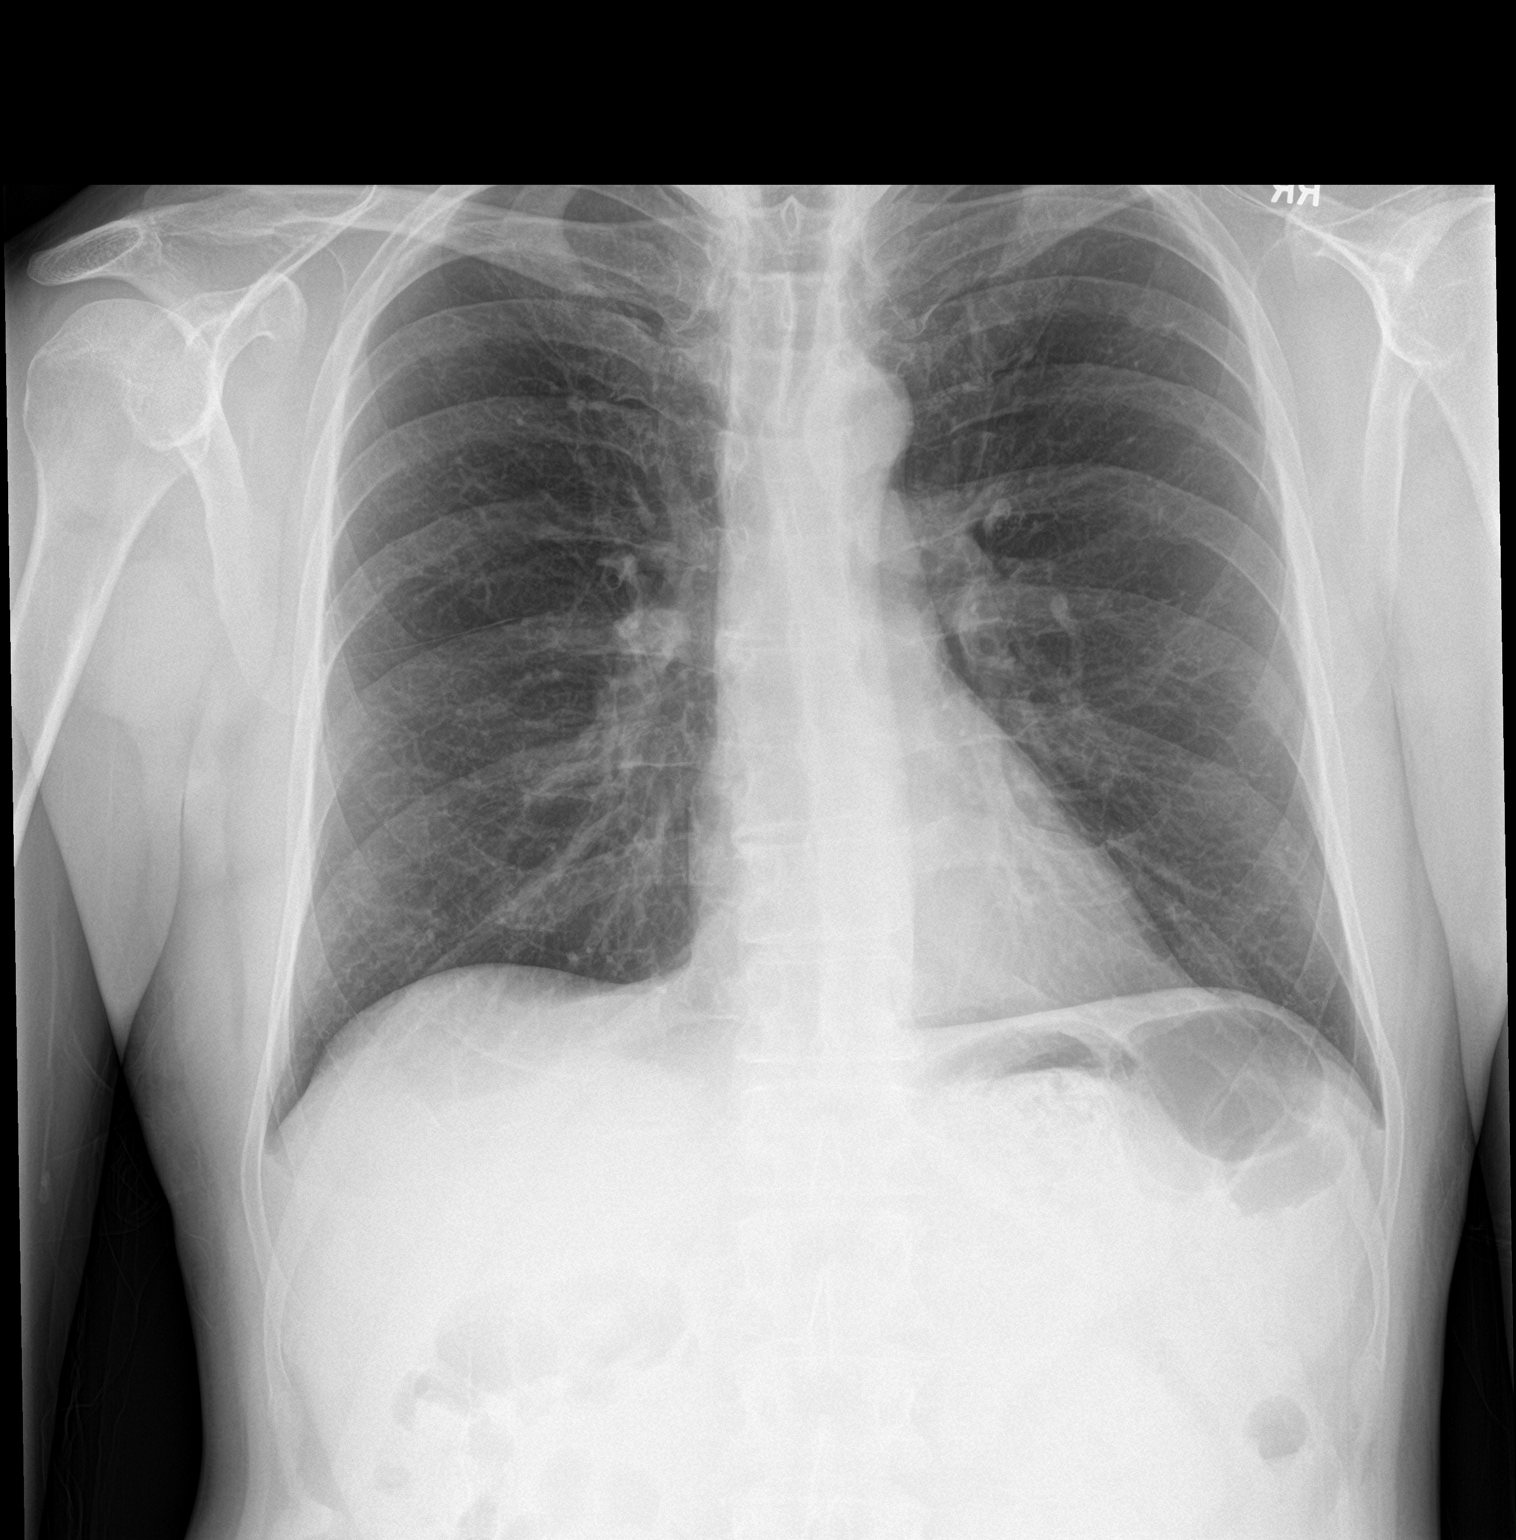

[chest lat]
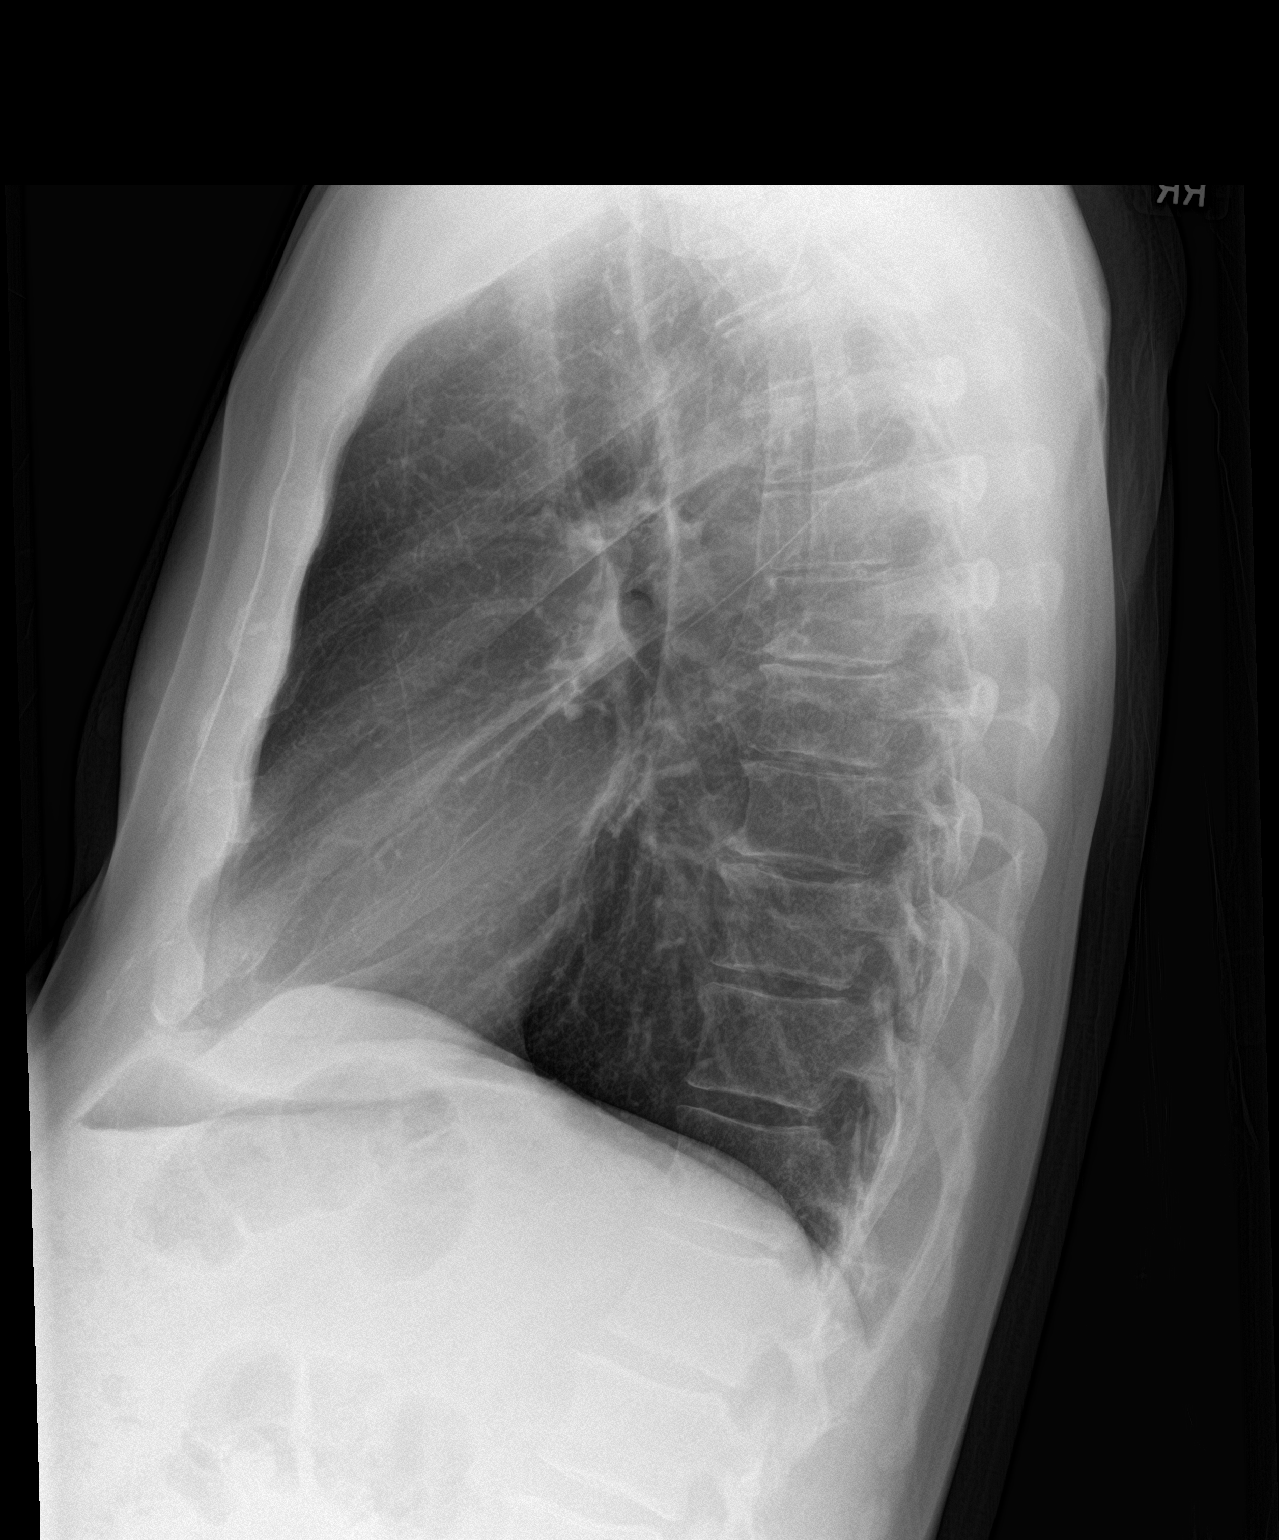

[2 of 2 positions shown; findings below may reference images not displayed]

FINDINGS: Lungs clear. Heart size normal. No pneumothorax or pleural fluid. No
acute or focal bony abnormality.
IMPRESSION: Negative chest.

## 2023-06-17 ENCOUNTER — Encounter: Payer: Self-pay | Admitting: Family

## 2023-06-17 MED ORDER — PANTOPRAZOLE SODIUM 40 MG PO TBEC: 40.0000 mg | DELAYED_RELEASE_TABLET | Freq: Every day | ORAL | 1 refills | Status: DC

## 2023-08-08 ENCOUNTER — Encounter: Payer: Self-pay | Admitting: Internal Medicine

## 2023-08-16 ENCOUNTER — Other Ambulatory Visit: Payer: Self-pay | Admitting: Family

## 2023-10-23 ENCOUNTER — Encounter: Payer: Self-pay | Admitting: Family

## 2023-10-23 ENCOUNTER — Ambulatory Visit (AMBULATORY_SURGERY_CENTER): Payer: 59

## 2023-10-23 VITALS — Ht 69.0 in | Wt 165.0 lb

## 2023-10-23 DIAGNOSIS — Z860100 Personal history of colon polyps, unspecified: Secondary | ICD-10-CM

## 2023-10-23 MED ORDER — SIMVASTATIN 40 MG PO TABS: 40.0000 mg | ORAL_TABLET | Freq: Every day | ORAL | 0 refills | Status: DC

## 2023-10-23 MED ORDER — LEVOTHYROXINE SODIUM 50 MCG PO TABS: 50.0000 ug | ORAL_TABLET | Freq: Every day | ORAL | 0 refills | Status: DC

## 2023-10-23 MED ORDER — NA SULFATE-K SULFATE-MG SULF 17.5-3.13-1.6 GM/177ML PO SOLN
1.0000 | Freq: Once | ORAL | 0 refills | Status: AC
Start: 2023-10-23 — End: 2023-10-23

## 2023-10-23 NOTE — Progress Notes (Signed)
No egg or soy allergy known to patient  No issues known to pt with past sedation with any surgeries or procedures Patient denies ever being told they had issues or difficulty with intubation  No FH of Malignant Hyperthermia Pt is not on diet pills Pt is not on  home 02  Pt is not on blood thinners  Pt denies issues with constipation  No A fib or A flutter Have any cardiac testing pending--no  LOA: independent  Prep: suprep   Patient's chart reviewed by Cathlyn Parsons CNRA prior to previsit and patient appropriate for the [facility].  Previsit completed and red dot placed by patient's name on their procedure day (on provider's schedule).     PV competed with patient. Prep instructions sent via mychart. Goodrx coupon for [facility] provided to use for price reduction if needed.

## 2023-11-04 ENCOUNTER — Encounter: Payer: Self-pay | Admitting: Internal Medicine

## 2023-11-07 ENCOUNTER — Other Ambulatory Visit: Payer: Self-pay | Admitting: Family

## 2023-11-13 ENCOUNTER — Encounter: Payer: Self-pay | Admitting: Internal Medicine

## 2023-11-13 ENCOUNTER — Ambulatory Visit (AMBULATORY_SURGERY_CENTER): Payer: 59 | Admitting: Internal Medicine

## 2023-11-13 VITALS — BP 107/57 | HR 62 | Temp 98.7°F | Resp 15 | Ht 69.0 in | Wt 165.0 lb

## 2023-11-13 DIAGNOSIS — Z860100 Personal history of colon polyps, unspecified: Secondary | ICD-10-CM

## 2023-11-13 DIAGNOSIS — J45909 Unspecified asthma, uncomplicated: Secondary | ICD-10-CM | POA: Diagnosis not present

## 2023-11-13 DIAGNOSIS — Z09 Encounter for follow-up examination after completed treatment for conditions other than malignant neoplasm: Secondary | ICD-10-CM | POA: Diagnosis not present

## 2023-11-13 DIAGNOSIS — E7800 Pure hypercholesterolemia, unspecified: Secondary | ICD-10-CM | POA: Diagnosis not present

## 2023-11-13 DIAGNOSIS — E039 Hypothyroidism, unspecified: Secondary | ICD-10-CM | POA: Diagnosis not present

## 2023-11-13 DIAGNOSIS — Z1211 Encounter for screening for malignant neoplasm of colon: Secondary | ICD-10-CM | POA: Diagnosis not present

## 2023-11-13 MED ORDER — SODIUM CHLORIDE 0.9 % IV SOLN: 500.0000 mL | Freq: Once | INTRAVENOUS | Status: DC

## 2023-11-13 NOTE — Progress Notes (Signed)
Report to PACU, RN, vss, BBS= Clear.  

## 2023-11-13 NOTE — Progress Notes (Signed)
GASTROENTEROLOGY PROCEDURE H&P NOTE   Primary Care Physician: Sandford Craze, NP    Reason for Procedure:  History of adenomatous colon polyps  Plan:    Colonoscopy  Patient is appropriate for endoscopic procedure(s) in the ambulatory ([facility]) setting.  The nature of the procedure, as well as the risks, benefits, and alternatives were carefully and thoroughly reviewed with the patient. Ample time for discussion and questions allowed. The patient understood, was satisfied, and agreed to proceed.     HPI: Thomas Valencia is a 62 y.o. male who presents for surveillance colonoscopy.  Medical history as below.  Tolerated the prep.  No recent chest pain or shortness of breath.  No abdominal pain today.  Past Medical History:  Diagnosis Date   Allergy    Asthma 10/31/2021   Hearing loss    Hypercholesteremia    Hypothyroidism 10/07/2016   Rhinitis    Tinnitus, right    Vestibular schwannoma ([facility]) 12/24/12    Past Surgical History:  Procedure Laterality Date   COLONOSCOPY  08/31/2013   NO PAST SURGERIES     denies surgical history   POLYPECTOMY     WISDOM TOOTH EXTRACTION      Prior to Admission medications   Medication Sig Start Date End Date Taking? Authorizing Provider  cetirizine (ZYRTEC) 10 MG tablet Take 10 mg by mouth daily. 07/01/18  Yes [provider]  fluticasone (FLONASE) 50 MCG/ACT nasal spray Place 2 sprays into both nostrils daily. 10/11/17  Yes Sandford Craze, NP  levothyroxine (SYNTHROID) 50 MCG tablet Take 1 tablet by mouth daily before breakfast. 11/07/23  Yes Sandford Craze, NP  simvastatin (ZOCOR) 40 MG tablet Take 1 tablet (40 mg total) by mouth daily. 10/23/23  Yes Sandford Craze, NP  tamsulosin (FLOMAX) 0.4 MG CAPS capsule Take 1 capsule (0.4 mg total) by mouth daily after supper. 11/27/22  Yes Sandford Craze, NP  albuterol (VENTOLIN HFA) 108 (90 Base) MCG/ACT inhaler Inhale 2 puffs into the lungs every 6 (six) hours as  needed for wheezing or shortness of breath. Patient not taking: Reported on 10/23/2023 03/19/23   Sandford Craze, NP  budesonide-formoterol [facility]) 80-4.5 MCG/ACT inhaler Inhale 2 puffs into the lungs 2 (two) times daily. Patient not taking: Reported on 10/23/2023 03/19/23   Sandford Craze, NP  fexofenadine (ALLEGRA ODT) 30 MG disintegrating tablet Take 30 mg by mouth daily.    [provider]  omeprazole (PRILOSEC OTC) 20 MG tablet Take 20 mg by mouth daily as needed. Takes 3-4 days as needed then stops with improvement of symptoms    [provider]  pantoprazole (PROTONIX) 40 MG tablet Take 1 tablet (40 mg total) by mouth daily. Patient not taking: Reported on 10/23/2023 06/17/23   Sandford Craze, NP    Current Outpatient Medications  Medication Sig Dispense Refill   cetirizine (ZYRTEC) 10 MG tablet Take 10 mg by mouth daily.     fluticasone (FLONASE) 50 MCG/ACT nasal spray Place 2 sprays into both nostrils daily. 16 g 6   levothyroxine (SYNTHROID) 50 MCG tablet Take 1 tablet by mouth daily before breakfast. 90 tablet 0   simvastatin (ZOCOR) 40 MG tablet Take 1 tablet (40 mg total) by mouth daily. 90 tablet 0   tamsulosin (FLOMAX) 0.4 MG CAPS capsule Take 1 capsule (0.4 mg total) by mouth daily after supper. 90 capsule 1   albuterol (VENTOLIN HFA) 108 (90 Base) MCG/ACT inhaler Inhale 2 puffs into the lungs every 6 (six) hours as needed for wheezing or  shortness of breath. (Patient not taking: Reported on 10/23/2023) 8 g 1   budesonide-formoterol (SYMBICORT) 80-4.5 MCG/ACT inhaler Inhale 2 puffs into the lungs 2 (two) times daily. (Patient not taking: Reported on 10/23/2023) 1 each 3   fexofenadine (ALLEGRA ODT) 30 MG disintegrating tablet Take 30 mg by mouth daily.     omeprazole (PRILOSEC OTC) 20 MG tablet Take 20 mg by mouth daily as needed. Takes 3-4 days as needed then stops with improvement of symptoms     pantoprazole (PROTONIX) 40 MG tablet Take 1  tablet (40 mg total) by mouth daily. (Patient not taking: Reported on 10/23/2023) 90 tablet 1   Current Facility-Administered Medications  Medication Dose Route Frequency Provider Last Rate Last Admin   0.9 %  sodium chloride infusion  500 mL Intravenous Once Amora Sheehy, Carie Caddy, MD        Allergies as of 11/13/2023 - Review Complete 11/13/2023  Allergen Reaction Noted   Dust mite extract  12/24/1988   Molds & smuts  12/24/1988    Family History  Problem Relation Age of Onset   Hyperlipidemia Mother    Hypertension Mother    Hyperlipidemia Father    Hyperlipidemia Brother    Other Neg Hx        colon ca, prostate ca   Colon cancer Neg Hx    Colon polyps Neg Hx    Esophageal cancer Neg Hx    Stomach cancer Neg Hx    Rectal cancer Neg Hx     Social History   Socioeconomic History   Marital status: Married    Spouse name: Not on file   Number of children: Not on file   Years of education: Not on file   Highest education level: Not on file  Occupational History   Not on file  Tobacco Use   Smoking status: Never   Smokeless tobacco: Never  Vaping Use   Vaping status: Never Used  Substance and Sexual Activity   Alcohol use: Yes    Alcohol/week: 3.0 standard drinks of alcohol    Types: 3 Standard drinks or equivalent per week    Comment: or less   Drug use: Yes    Types: Marijuana    Comment: occasional THC Gummies, last used about 3 days ago   Sexual activity: Yes    Partners: Female  Other Topics Concern   Not on file  Social History Narrative   Occupation: Agricultural consultant ( makes corporate commercials, [employer] )   Married with one son - lives in [place]   married   Alcohol use-yes   Never Smoked   Social Determinants of Health   Financial Resource Strain: Not on file  Food Insecurity: Not on file  Transportation Needs: Not on file  Physical Activity: Not on file  Stress: Not on file  Social Connections: Not on file  Intimate Partner Violence: Not on file     Physical Exam: Vital signs in last 24 hours: @BP  137/82   Pulse 76   Temp 98.7 F (37.1 C)   Ht 5\' 9"  (1.753 m)   Wt 165 lb (74.8 kg)   SpO2 95%   BMI 24.37 kg/m  GEN: NAD EYE: Sclerae anicteric ENT: MMM CV: Non-tachycardic Pulm: CTA b/l GI: Soft, NT/ND NEURO:  Alert & Oriented x 3   Erick Blinks, MD [facility]  11/13/2023 8:55 AM

## 2023-11-13 NOTE — Patient Instructions (Signed)
Resume previous diet and medications. Repeat Colonoscopy in 10 years for surveillance purposes.  YOU HAD AN ENDOSCOPIC PROCEDURE TODAY AT THE [facility]:   Refer to the procedure report that was given to you for any specific questions about what was found during the examination.  If the procedure report does not answer your questions, please call your gastroenterologist to clarify.  If you requested that your care partner not be given the details of your procedure findings, then the procedure report has been included in a sealed envelope for you to review at your convenience later.  YOU SHOULD EXPECT: Some feelings of bloating in the abdomen. Passage of more gas than usual.  Walking can help get rid of the air that was put into your GI tract during the procedure and reduce the bloating. If you had a lower endoscopy (such as a colonoscopy or flexible sigmoidoscopy) you may notice spotting of blood in your stool or on the toilet paper. If you underwent a bowel prep for your procedure, you may not have a normal bowel movement for a few days.  Please Note:  You might notice some irritation and congestion in your nose or some drainage.  This is from the oxygen used during your procedure.  There is no need for concern and it should clear up in a day or so.  SYMPTOMS TO REPORT IMMEDIATELY:  Following lower endoscopy (colonoscopy or flexible sigmoidoscopy):  Excessive amounts of blood in the stool  Significant tenderness or worsening of abdominal pains  Swelling of the abdomen that is new, acute  Fever of 100F or higher   For urgent or emergent issues, a gastroenterologist can be reached at any hour by calling [phone]. Do not use MyChart messaging for urgent concerns.    DIET:  We do recommend a small meal at first, but then you may proceed to your regular diet.  Drink plenty of fluids but you should avoid alcoholic beverages for 24 hours.  ACTIVITY:  You should plan to take it  easy for the rest of today and you should NOT DRIVE or use heavy machinery until tomorrow (because of the sedation medicines used during the test).    FOLLOW UP: Our staff will call the number listed on your records the next business day following your procedure.  We will call around 7:15- 8:00 am to check on you and address any questions or concerns that you may have regarding the information given to you following your procedure. If we do not reach you, we will leave a message.     If any biopsies were taken you will be contacted by phone or by letter within the next 1-3 weeks.  Please call us at [phone] if you have not heard about the biopsies in 3 weeks.    SIGNATURES/CONFIDENTIALITY: You and/or your care partner have signed paperwork which will be entered into your electronic medical record.  These signatures attest to the fact that that the information above on your After Visit Summary has been reviewed and is understood.  Full responsibility of the confidentiality of this discharge information lies with you and/or your care-partner.

## 2023-11-13 NOTE — Progress Notes (Signed)
Pt's states no medical or surgical changes since previsit or office visit. 

## 2023-11-13 NOTE — Op Note (Signed)
[facility] Patient Name: Thomas Valencia Procedure Date: 11/13/2023 8:57 AM MRN: [id number] Endoscopist: Beverley Fiedler , MD, [id number] Age: 62 Referring MD:  Date of Birth: 11-09-1961 Gender: Male Account #: [id number] Procedure:                Colonoscopy Indications:              High risk colon cancer surveillance: Personal                            history of non-advanced adenoma, Last colonoscopy:                            October 2019 (TA x 1), Sept 2014 (TA x 1) Medicines:                Monitored Anesthesia Care Procedure:                Pre-Anesthesia Assessment:                           - Prior to the procedure, a History and Physical                            was performed, and patient medications and                            allergies were reviewed. The patient's tolerance of                            previous anesthesia was also reviewed. The risks                            and benefits of the procedure and the sedation                            options and risks were discussed with the patient.                            All questions were answered, and informed consent                            was obtained. Prior Anticoagulants: The patient has                            taken no anticoagulant or antiplatelet agents. ASA                            Grade Assessment: II - A patient with mild systemic                            disease. After reviewing the risks and benefits,                            the patient was deemed in satisfactory condition to  undergo the procedure.                           After obtaining informed consent, the colonoscope                            was passed under direct vision. Throughout the                            procedure, the patient's blood pressure, pulse, and                            oxygen saturations were monitored continuously. The                            Olympus CF-HQ190L  ([id number]) Colonoscope was                            introduced through the anus and advanced to the                            cecum, identified by appendiceal orifice and                            ileocecal valve. The colonoscopy was performed                            without difficulty. The patient tolerated the                            procedure well. The quality of the bowel                            preparation was good. The ileocecal valve,                            appendiceal orifice, and rectum were photographed. Scope In: 9:01:45 AM Scope Out: 9:12:42 AM Scope Withdrawal Time: 0 hours 7 minutes 48 seconds  Total Procedure Duration: 0 hours 10 minutes 57 seconds  Findings:                 The digital rectal exam was normal.                           A few small-mouthed diverticula were found in the                            sigmoid colon.                           The exam was otherwise without abnormality on                            direct and retroflexion views. Complications:            No immediate complications. Estimated Blood Loss:  Estimated blood loss: none. Impression:               - Diverticulosis in the sigmoid colon.                           - The examination was otherwise normal on direct                            and retroflexion views.                           - No specimens collected. Recommendation:           - Patient has a contact number available for                            emergencies. The signs and symptoms of potential                            delayed complications were discussed with the                            patient. Return to normal activities tomorrow.                            Written discharge instructions were provided to the                            patient.                           - Resume previous diet.                           - Continue present medications.                           - Repeat colonoscopy in  10 years for surveillance. Beverley Fiedler, MD 11/13/2023 9:16:05 AM This report has been signed electronically.

## 2023-11-14 ENCOUNTER — Encounter: Payer: Self-pay | Admitting: Internal Medicine

## 2023-11-14 ENCOUNTER — Telehealth: Payer: Self-pay | Admitting: *Deleted

## 2023-11-14 NOTE — Telephone Encounter (Signed)
Post procedure follow up phone call. No answer at number given.  Left message on voicemail.  

## 2023-11-22 ENCOUNTER — Encounter: Payer: 59 | Admitting: Family

## 2023-12-03 ENCOUNTER — Encounter: Payer: Self-pay | Admitting: Family

## 2023-12-03 DIAGNOSIS — Z0000 Encounter for general adult medical examination without abnormal findings: Secondary | ICD-10-CM

## 2023-12-09 ENCOUNTER — Encounter: Payer: 59 | Admitting: Family

## 2023-12-10 ENCOUNTER — Encounter: Payer: Self-pay | Admitting: Family

## 2023-12-10 ENCOUNTER — Ambulatory Visit (INDEPENDENT_AMBULATORY_CARE_PROVIDER_SITE_OTHER): Payer: 59 | Admitting: Family

## 2023-12-10 VITALS — BP 112/65 | HR 80 | Temp 97.6°F | Resp 16 | Ht 69.0 in | Wt 155.0 lb

## 2023-12-10 DIAGNOSIS — Z114 Encounter for screening for human immunodeficiency virus [HIV]: Secondary | ICD-10-CM | POA: Diagnosis not present

## 2023-12-10 DIAGNOSIS — E785 Hyperlipidemia, unspecified: Secondary | ICD-10-CM | POA: Diagnosis not present

## 2023-12-10 DIAGNOSIS — E039 Hypothyroidism, unspecified: Secondary | ICD-10-CM

## 2023-12-10 DIAGNOSIS — Z0000 Encounter for general adult medical examination without abnormal findings: Secondary | ICD-10-CM

## 2023-12-10 DIAGNOSIS — Z125 Encounter for screening for malignant neoplasm of prostate: Secondary | ICD-10-CM | POA: Diagnosis not present

## 2023-12-10 DIAGNOSIS — K219 Gastro-esophageal reflux disease without esophagitis: Secondary | ICD-10-CM

## 2023-12-10 LAB — TSH: TSH: 2.77 u[IU]/mL (ref 0.35–5.50)

## 2023-12-10 LAB — LIPID PANEL
Cholesterol: 207 mg/dL — ABNORMAL HIGH (ref 0–200)
HDL: 47.9 mg/dL (ref >39.00)
LDL Cholesterol: 122 mg/dL — ABNORMAL HIGH (ref 0–99)
NonHDL: 159.17
Total CHOL/HDL Ratio: 4
Triglycerides: 187 mg/dL — ABNORMAL HIGH (ref 0.0–149.0)
VLDL: 37.4 mg/dL (ref 0.0–40.0)

## 2023-12-10 LAB — COMPREHENSIVE METABOLIC PANEL
ALT: 15 U/L (ref 0–53)
AST: 16 U/L (ref 0–37)
Albumin: 4.5 g/dL (ref 3.5–5.2)
Alkaline Phosphatase: 57 U/L (ref 39–117)
BUN: 21 mg/dL (ref 6–23)
CO2: 32 meq/L (ref 19–32)
Calcium: 9.2 mg/dL (ref 8.4–10.5)
Chloride: 103 meq/L (ref 96–112)
Creatinine, Ser: 0.97 mg/dL (ref 0.40–1.50)
GFR: 84.28 mL/min (ref >60.00)
Glucose, Bld: 98 mg/dL (ref 70–99)
Potassium: 4.7 meq/L (ref 3.5–5.1)
Sodium: 140 meq/L (ref 135–145)
Total Bilirubin: 0.8 mg/dL (ref 0.2–1.2)
Total Protein: 6.8 g/dL (ref 6.0–8.3)

## 2023-12-10 LAB — PSA: PSA: 4.47 ng/mL — ABNORMAL HIGH (ref 0.10–4.00)

## 2023-12-10 NOTE — Patient Instructions (Signed)
VISIT SUMMARY:  Today, we reviewed your overall health and addressed your ongoing conditions, including your thyroid condition, high cholesterol, and benign prostatic hyperplasia. We also discussed your recent experience with GERD symptoms and your general health maintenance. You are up-to-date with your immunizations and recent screenings, including a colonoscopy. We have planned some follow-up tests and referrals to ensure your continued well-being.  YOUR PLAN:  -HYPOTHYROIDISM: Hypothyroidism is a condition where your thyroid gland does not produce enough thyroid hormone. Your condition is stable on Synthroid 50 mcg. We will check your TSH levels today to ensure your medication is still effective.  -HYPERLIPIDEMIA: Hyperlipidemia means you have high levels of cholesterol in your blood. You are currently taking Simvastatin 20 mg. We will check your lipid panel today to monitor your cholesterol levels.  -BENIGN PROSTATIC HYPERPLASIA: Benign Prostatic Hyperplasia is an enlargement of the prostate gland that can cause urinary issues. You are stable on Tamsulosin with no urinary complaints. We will add a PSA test to your lab work today to monitor your prostate health.  -GERD: GERD is a condition where stomach acid frequently flows back into the tube connecting your mouth and stomach. You have stopped taking Pantoprazole and are not experiencing heartburn, but you do have phlegm after eating at night. We will continue to monitor your symptoms without immediate intervention.  -GENERAL HEALTH MAINTENANCE: You are up-to-date with your immunizations, including the COVID-19 booster. Your recent colonoscopy was normal, and you do not need another for ten years. You are also current with your dental and vision check-ups. We will add an HIV screening to your lab work today. Continue your regular exercise and healthy diet. Follow up with a dermatologist for your intermittent eczema and with an otolaryngologist for  your hearing issues.  INSTRUCTIONS:  Please complete the lab tests for TSH, lipid panel, PSA, and HIV screening today. Follow up with Dr. Raelyn Mora, the urologist in the [facility], if your PSA is elevated; if normal, recheck in a year. Continue your regular exercise and healthy diet, and follow up with a dermatologist for your eczema and an otolaryngologist for your hearing issues.

## 2023-12-10 NOTE — Assessment & Plan Note (Addendum)
Update lipid panel. Continue simastatin.

## 2023-12-10 NOTE — Assessment & Plan Note (Signed)
Stable without medicine.

## 2023-12-10 NOTE — Assessment & Plan Note (Signed)
Continues synthroid .  Lab Results  Component Value Date   TSH 2.38 11/20/2022   Clinically stable on synthroid.

## 2023-12-10 NOTE — Assessment & Plan Note (Signed)
-  Immunizations up to date. -Colonoscopy done in 2024 with no need for repeat for 10 years. -Regular dental and vision check-ups. -Add HIV screening to lab work today. -Referral to urologist in [facility] (Dr. Raelyn Mora) if PSA is elevated. If PSA is normal, recheck in a year. -Continue regular exercise and healthy diet. -Follow-up with dermatologist for intermittent eczema. -Follow-up with otolaryngologist for hearing issues.

## 2023-12-10 NOTE — Progress Notes (Signed)
Subjective:     Patient ID: Thomas Valencia, male    DOB: 1961/03/16, 62 y.o.   MRN: [id number]  Chief Complaint  Patient presents with   Annual Exam    HPI  Discussed the use of AI scribe software for clinical note transcription with the patient, who gave verbal consent to proceed.  History of Present Illness   The patient, with a history of GERD, high cholesterol, and a thyroid condition, presents for a general check-up. He reports being up-to-date with all immunizations, including the COVID-19 booster shot. He had a mild case of COVID-19 a month ago, with symptoms of a runny nose for twelve hours and fatigue for three days. He has been maintaining a healthy diet and continues to exercise, mainly through walking. He recently had a colonoscopy, with results indicating no need for another for ten years. He is up-to-date with vision and dental checks.  The patient has been seeing a urologist annually and is currently taking tamsulosin, which he reports is working well. He has a history of eczema, which occasionally flares up, but currently, he has no active symptoms. He has stopped taking pantoprazole for GERD as he no longer experiences heartburn, but he does report phlegm after eating at night. He is unsure if this is due to postnasal drip Thomas a digestive issue. He is currently taking Synthroid 50 mcg for his thyroid condition.  The patient also reports a hearing issue in the right ear and is due for a follow-up with an otolaryngologist after an upcoming MRI. He has no current concerns about depression Thomas anxiety.   He will be transferring his care to the [facility] due to his new insurance coverage.     Immunizations: up to date Diet: healthy Exercise: enjoys walking Colonoscopy: last week, normal Vision: up to date Dental: up to date       Health Maintenance Due  Topic Date Due   HIV Screening  Never done    Past Medical History:  Diagnosis Date   Allergy    Asthma  10/31/2021   Hearing loss    Hypercholesteremia    Hypothyroidism 10/07/2016   Rhinitis    Tinnitus, right    Vestibular schwannoma (HCC) 12/24/12    Past Surgical History:  Procedure Laterality Date   COLONOSCOPY  08/31/2013   NO PAST SURGERIES     denies surgical history   POLYPECTOMY     WISDOM TOOTH EXTRACTION      Family History  Problem Relation Age of Onset   Hyperlipidemia Mother    Hypertension Mother    Hyperlipidemia Father    Hyperlipidemia Brother    Other Neg Hx        colon ca, prostate ca   Colon cancer Neg Hx    Colon polyps Neg Hx    Esophageal cancer Neg Hx    Stomach cancer Neg Hx    Rectal cancer Neg Hx     Social History   Socioeconomic History   Marital status: Married    Spouse name: Not on file   Number of children: Not on file   Years of education: Not on file   Highest education level: Not on file  Occupational History   Not on file  Tobacco Use   Smoking status: Never   Smokeless tobacco: Never  Vaping Use   Vaping status: Never Used  Substance and Sexual Activity   Alcohol use: Yes    Alcohol/week: 3.0 standard drinks of alcohol  Types: 3 Standard drinks Thomas equivalent per week    Comment: Thomas less   Drug use: Yes    Types: Marijuana    Comment: occasional THC Gummies, last used about 3 days ago   Sexual activity: Yes    Partners: Female  Other Topics Concern   Not on file  Social History Narrative   Occupation: Agricultural consultant ( makes corporate commercials, [employer] )   Married with one son - lives in [place]   married   Alcohol use-yes   Never Smoked   Social Drivers of Corporate investment banker Strain: Not on file  Food Insecurity: Not on file  Transportation Needs: Not on file  Physical Activity: Not on file  Stress: Not on file  Social Connections: Not on file  Intimate Partner Violence: Not on file    Outpatient Medications Prior to Visit  Medication Sig Dispense Refill   cetirizine (ZYRTEC) 10 MG tablet  Take 10 mg by mouth daily.     fexofenadine (ALLEGRA ODT) 30 MG disintegrating tablet Take 30 mg by mouth daily.     fluticasone (FLONASE) 50 MCG/ACT nasal spray Place 2 sprays into both nostrils daily. 16 g 6   levothyroxine (SYNTHROID) 50 MCG tablet Take 1 tablet by mouth daily before breakfast. 90 tablet 0   simvastatin (ZOCOR) 40 MG tablet Take 1 tablet (40 mg total) by mouth daily. 90 tablet 0   tamsulosin (FLOMAX) 0.4 MG CAPS capsule Take 1 capsule (0.4 mg total) by mouth daily after supper. 90 capsule 1   albuterol (VENTOLIN HFA) 108 (90 Base) MCG/ACT inhaler Inhale 2 puffs into the lungs every 6 (six) hours as needed for wheezing Thomas shortness of breath. (Patient not taking: Reported on 10/23/2023) 8 g 1   budesonide-formoterol (SYMBICORT) 80-4.5 MCG/ACT inhaler Inhale 2 puffs into the lungs 2 (two) times daily. (Patient not taking: Reported on 10/23/2023) 1 each 3   omeprazole (PRILOSEC OTC) 20 MG tablet Take 20 mg by mouth daily as needed. Takes 3-4 days as needed then stops with improvement of symptoms     pantoprazole (PROTONIX) 40 MG tablet Take 1 tablet (40 mg total) by mouth daily. (Patient not taking: Reported on 10/23/2023) 90 tablet 1   No facility-administered medications prior to visit.    Allergies  Allergen Reactions   Dust Mite Extract     rhinitis   Molds & Smuts     rhinitis    Review of Systems  Constitutional:  Negative for weight loss.  HENT:  Positive for hearing loss (right side hearing loss). Negative for congestion.   Eyes:  Negative for blurred vision.  Respiratory:  Negative for cough.   Cardiovascular:  Negative for leg swelling.  Gastrointestinal:  Negative for constipation, diarrhea and heartburn.  Genitourinary:  Negative for dysuria and frequency.  Musculoskeletal:  Negative for joint pain and myalgias.  Skin:  Positive for rash (occasionl eczema on trunk, scheduled with new dermatologist).  Neurological:  Negative for headaches.   Psychiatric/Behavioral:         Denies depression/anxiety       Objective:    Physical Exam   BP 112/65 (BP Location: Right Arm, Patient Position: Sitting, Cuff Size: Small)   Pulse 80   Temp 97.6 F (36.4 C) (Oral)   Resp 16   Ht 5\' 9"  (1.753 m)   Wt 155 lb (70.3 kg)   SpO2 96%   BMI 22.89 kg/m  Wt Readings from Last 3 Encounters:  12/10/23 155  lb (70.3 kg)  11/13/23 165 lb (74.8 kg)  10/23/23 165 lb (74.8 kg)   Physical Exam  Constitutional: He is oriented to person, place, and time. He appears well-developed and well-nourished. No distress.  HENT:  Head: Normocephalic and atraumatic.  Right Ear: Tympanic membrane and ear canal normal.  Left Ear: Tympanic membrane and ear canal normal.  Mouth/Throat: Oropharynx is clear and moist.  Eyes: Pupils are equal, round, and reactive to light. No scleral icterus.  Neck: Normal range of motion. No thyromegaly present.  Cardiovascular: Normal rate and regular rhythm.   No murmur heard. Pulmonary/Chest: Effort normal and breath sounds normal. No respiratory distress. He has no wheezes. He has no rales. He exhibits no tenderness.  Abdominal: Soft. Bowel sounds are normal. He exhibits no distension and no mass. There is no tenderness. There is no rebound and no guarding.  Musculoskeletal: He exhibits no edema.  Lymphadenopathy:    He has no cervical adenopathy.  Neurological: He is alert and oriented to person, place, and time. He has normal patellar reflexes. He exhibits normal muscle tone. Coordination normal.  Skin: Skin is warm and dry.  Psychiatric: He has a normal mood and affect. His behavior is normal. Judgment and thought content normal.           Assessment & Plan:       Assessment & Plan:   Problem List Items Addressed This Visit       Unprioritized   Routine general medical examination at a health care facility - Primary    -Immunizations up to date. -Colonoscopy done in 2024 with no need for repeat  for 10 years. -Regular dental and vision check-ups. -Add HIV screening to lab work today. -Referral to urologist in [facility] (Dr. Raelyn Mora) if PSA is elevated. If PSA is normal, recheck in a year. -Continue regular exercise and healthy diet. -Follow-up with dermatologist for intermittent eczema. -Follow-up with otolaryngologist for hearing issues.      Hypothyroidism   Continues synthroid .  Lab Results  Component Value Date   TSH 2.38 11/20/2022   Clinically stable on synthroid.       Relevant Orders   TSH   Hyperlipidemia   Update lipid panel. Continue simastatin.       Relevant Orders   Lipid panel   Comp Met (CMET)   GERD (gastroesophageal reflux disease)   Stable without medicine.         Other Visit Diagnoses       Encounter for screening for HIV       Relevant Orders   HIV antibody (with reflex)     Screening for prostate cancer       Relevant Orders   PSA       I have discontinued Quavon A. Lorenz's albuterol, budesonide-formoterol, pantoprazole, and omeprazole. I am also having him maintain his fluticasone, fexofenadine, tamsulosin, cetirizine, simvastatin, and levothyroxine.  No orders of the defined types were placed in this encounter.

## 2023-12-11 ENCOUNTER — Telehealth: Payer: Self-pay | Admitting: Family

## 2023-12-11 DIAGNOSIS — R972 Elevated prostate specific antigen [PSA]: Secondary | ICD-10-CM

## 2023-12-11 LAB — HIV ANTIBODY (ROUTINE TESTING W REFLEX): HIV 1&2 Ab, 4th Generation: NONREACTIVE

## 2023-12-11 NOTE — Telephone Encounter (Signed)
Copied from CRM [id number]. Topic: Referral - Question >> Dec 11, 2023 12:20 PM Danika B wrote: Reason for CRM: Chanel from [facility] [place] states that patient called to schedule appt for dermatology and referral was supposed to be sent over. Chanel says referral was never received. Callback [phone], Fax #[phone]

## 2023-12-11 NOTE — Telephone Encounter (Signed)
Re-faxed referral to fax number given.

## 2023-12-11 NOTE — Telephone Encounter (Signed)
See mychart.  

## 2023-12-12 DIAGNOSIS — H903 Sensorineural hearing loss, bilateral: Secondary | ICD-10-CM | POA: Diagnosis not present

## 2023-12-20 ENCOUNTER — Other Ambulatory Visit: Payer: Self-pay | Admitting: Family

## 2023-12-20 IMAGING — DX DG CHEST 2V
2 series · 2 of 2 positions shown · non-contrast
Comparison: 05/30/2021

CLINICAL DATA: 60-year-old male with a history of asthma

EXAM:
CHEST - 2 VIEW

[chest pa]
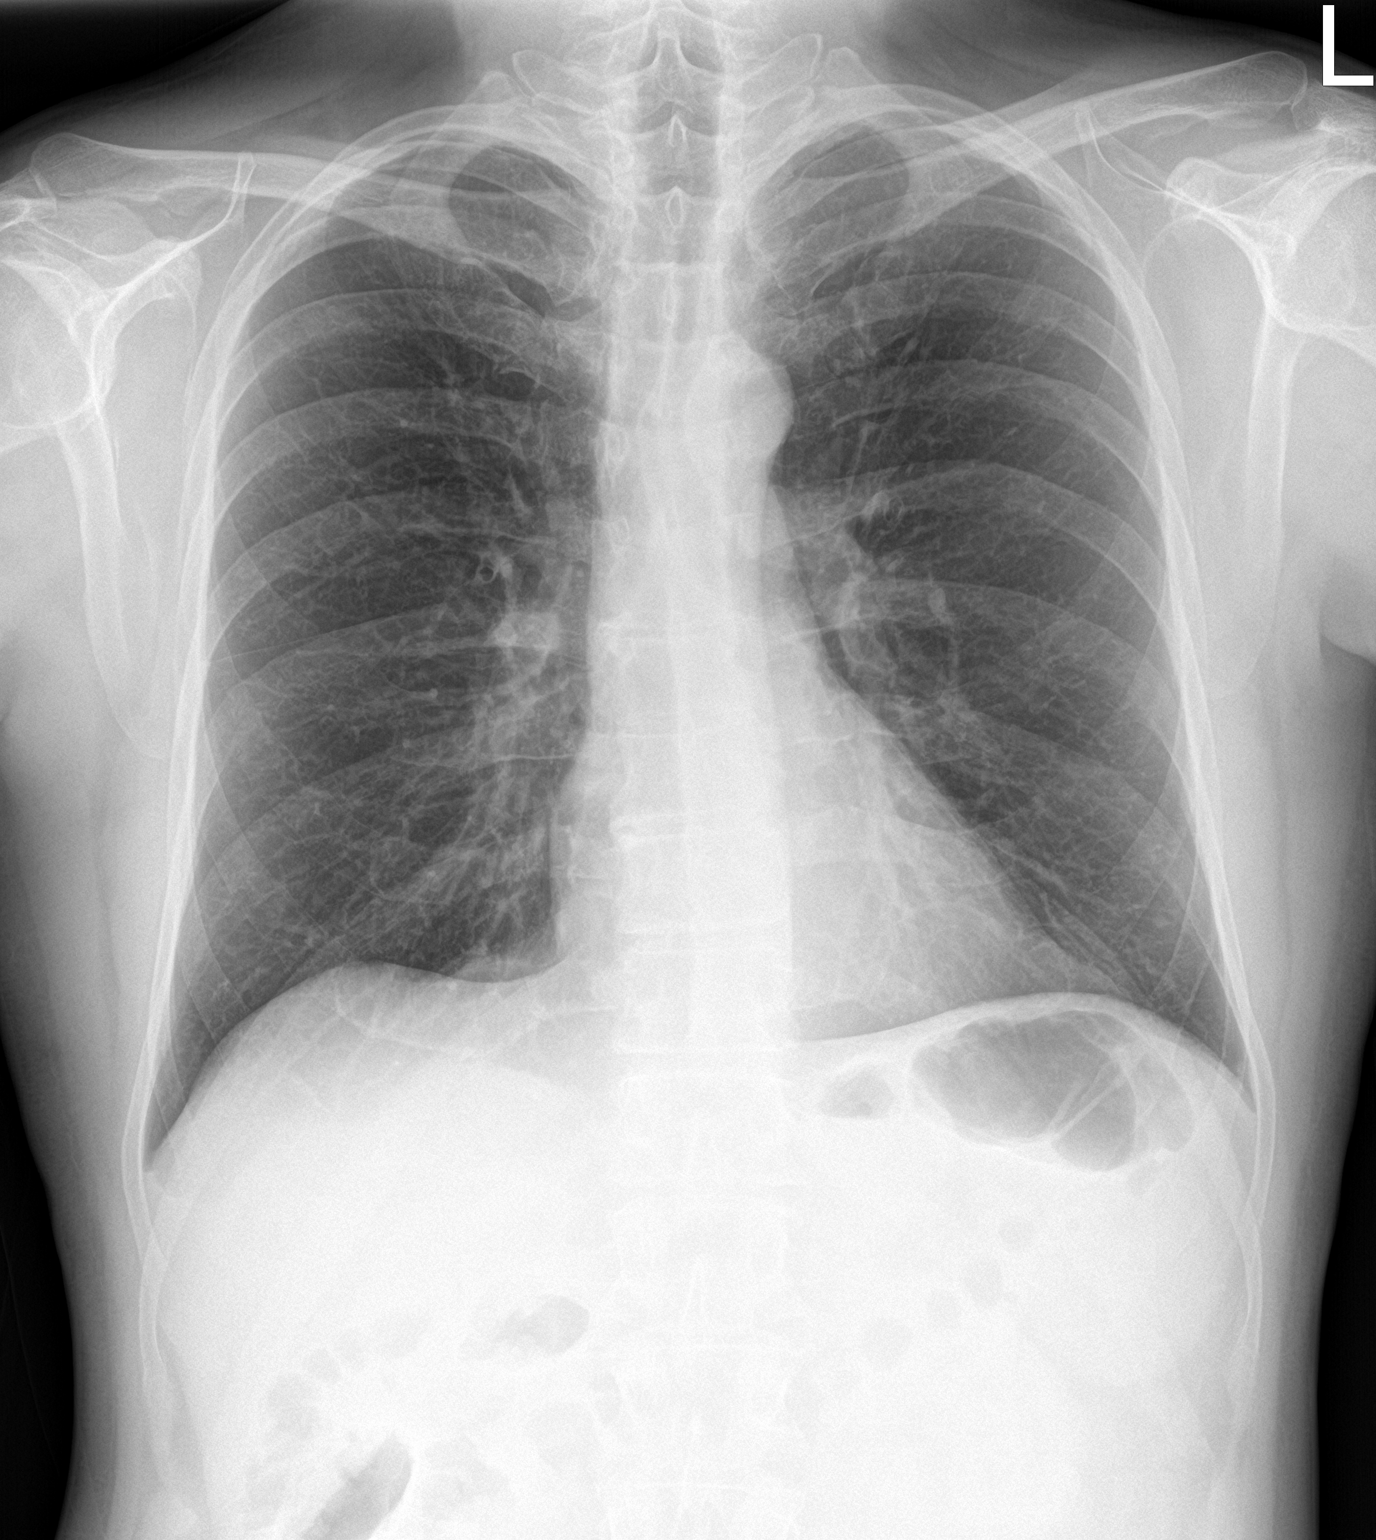

[chest lat]
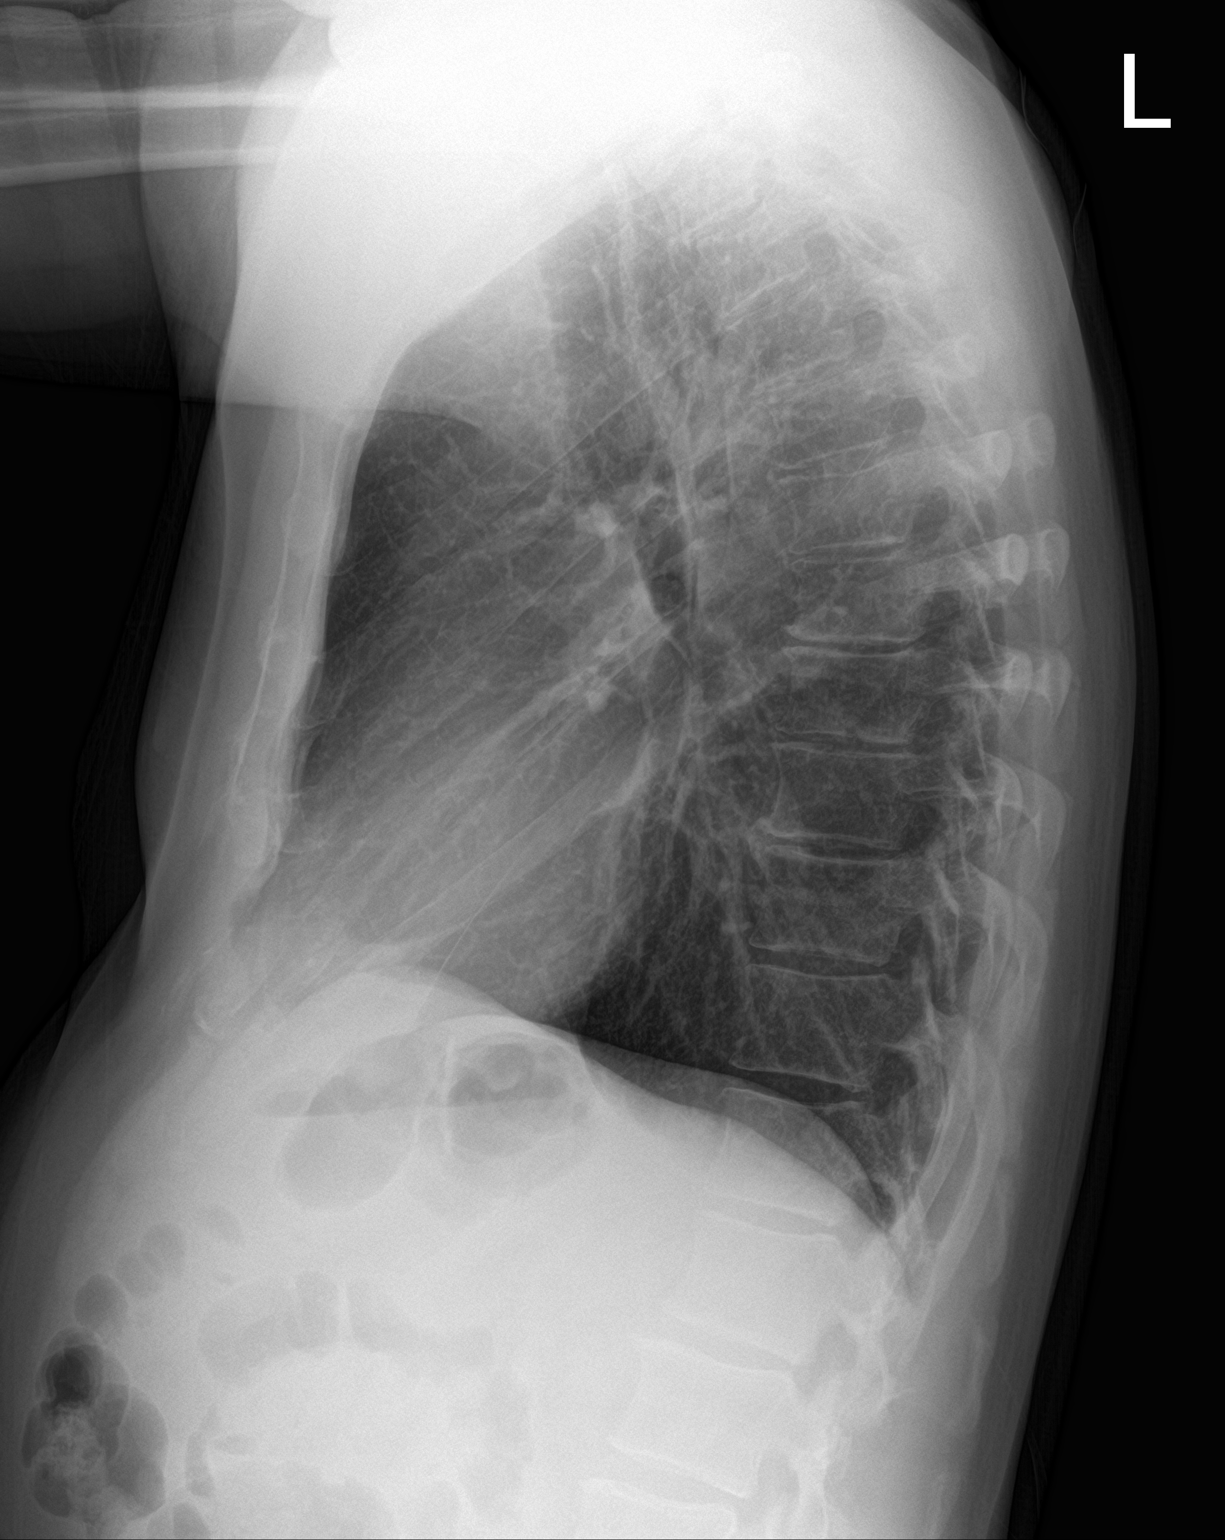

[2 of 2 positions shown; findings below may reference images not displayed]

FINDINGS: Cardiomediastinal silhouette unchanged in size and contour. No
evidence of central vascular congestion. No interlobular septal
thickening.

No pneumothorax or pleural effusion. Coarsened interstitial
markings, with no confluent airspace disease.

No acute displaced fracture. Degenerative changes of the spine.
IMPRESSION: No active cardiopulmonary disease.

## 2023-12-26 ENCOUNTER — Encounter: Payer: Self-pay | Admitting: Family

## 2023-12-26 MED ORDER — TAMSULOSIN HCL 0.4 MG PO CAPS: 0.4000 mg | ORAL_CAPSULE | Freq: Every day | ORAL | 1 refills | Status: DC

## 2023-12-26 MED ORDER — SIMVASTATIN 40 MG PO TABS: 40.0000 mg | ORAL_TABLET | Freq: Every day | ORAL | 1 refills | Status: DC

## 2023-12-26 MED ORDER — LEVOTHYROXINE SODIUM 50 MCG PO TABS: 50.0000 ug | ORAL_TABLET | Freq: Every day | ORAL | 1 refills | Status: DC

## 2023-12-27 ENCOUNTER — Other Ambulatory Visit: Payer: Self-pay | Admitting: Family

## 2024-04-21 ENCOUNTER — Encounter: Payer: Self-pay | Admitting: Family

## 2024-04-21 DIAGNOSIS — K219 Gastro-esophageal reflux disease without esophagitis: Secondary | ICD-10-CM

## 2024-04-22 MED ORDER — ALBUTEROL SULFATE HFA 108 (90 BASE) MCG/ACT IN AERS: 2.0000 | INHALATION_SPRAY | Freq: Four times a day (QID) | RESPIRATORY_TRACT | 0 refills | Status: DC | PRN

## 2024-04-22 MED ORDER — PANTOPRAZOLE SODIUM 40 MG PO TBEC: 40.0000 mg | DELAYED_RELEASE_TABLET | Freq: Every day | ORAL | 3 refills | Status: DC | PRN

## 2024-06-27 ENCOUNTER — Other Ambulatory Visit: Payer: Self-pay | Admitting: Family

## 2024-06-29 MED ORDER — SIMVASTATIN 40 MG PO TABS: 40.0000 mg | ORAL_TABLET | Freq: Every day | ORAL | 1 refills | Status: AC

## 2024-06-29 MED ORDER — LEVOTHYROXINE SODIUM 50 MCG PO TABS: 50.0000 ug | ORAL_TABLET | Freq: Every day | ORAL | 1 refills | Status: DC

## 2024-06-29 MED ORDER — ALBUTEROL SULFATE HFA 108 (90 BASE) MCG/ACT IN AERS: 2.0000 | INHALATION_SPRAY | Freq: Four times a day (QID) | RESPIRATORY_TRACT | 3 refills | Status: AC | PRN

## 2024-06-29 NOTE — Telephone Encounter (Signed)
 Please contact pt to schedule follow up.

## 2024-10-13 ENCOUNTER — Other Ambulatory Visit: Payer: Self-pay | Admitting: Family

## 2024-10-13 DIAGNOSIS — K219 Gastro-esophageal reflux disease without esophagitis: Secondary | ICD-10-CM

## 2024-10-14 MED ORDER — LEVOTHYROXINE SODIUM 50 MCG PO TABS: 50.0000 ug | ORAL_TABLET | Freq: Every day | ORAL | 0 refills | Status: DC

## 2024-10-14 MED ORDER — TAMSULOSIN HCL 0.4 MG PO CAPS: 0.4000 mg | ORAL_CAPSULE | Freq: Every day | ORAL | 0 refills | Status: AC

## 2024-12-20 ENCOUNTER — Other Ambulatory Visit: Payer: Self-pay | Admitting: Family

## 2025-01-06 ENCOUNTER — Other Ambulatory Visit: Payer: Self-pay | Admitting: Family

## 2025-01-06 DIAGNOSIS — K219 Gastro-esophageal reflux disease without esophagitis: Secondary | ICD-10-CM
